# Patient Record
Sex: Female | Born: 2000 | Race: Black or African American | Hispanic: No | Marital: Single | Smoking: Never smoker
Health system: Southern US, Community
[De-identification: ages and names within clinical notes are randomized; demographics above are authoritative.]

## PROBLEM LIST (undated history)

## (undated) DIAGNOSIS — G43909 Migraine, unspecified, not intractable, without status migrainosus: Secondary | ICD-10-CM

## (undated) DIAGNOSIS — R011 Cardiac murmur, unspecified: Secondary | ICD-10-CM

## (undated) DIAGNOSIS — I10 Essential (primary) hypertension: Secondary | ICD-10-CM

## (undated) HISTORY — DX: Cardiac murmur, unspecified: R01.1

## (undated) HISTORY — PX: WISDOM TOOTH EXTRACTION: SHX21

## (undated) HISTORY — DX: Essential (primary) hypertension: I10

---
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
## 2019-05-10 DIAGNOSIS — G43909 Migraine, unspecified, not intractable, without status migrainosus: Secondary | ICD-10-CM

## 2019-05-10 HISTORY — DX: Migraine, unspecified, not intractable, without status migrainosus: G43.909

## 2019-05-16 ENCOUNTER — Other Ambulatory Visit: Payer: Self-pay

## 2019-05-16 DIAGNOSIS — Z20822 Contact with and (suspected) exposure to covid-19: Secondary | ICD-10-CM

## 2019-05-18 ENCOUNTER — Encounter (HOSPITAL_COMMUNITY): Payer: Self-pay | Admitting: Emergency Medicine

## 2019-05-18 ENCOUNTER — Other Ambulatory Visit: Payer: Self-pay

## 2019-05-18 ENCOUNTER — Ambulatory Visit (HOSPITAL_COMMUNITY)
Admission: EM | Admit: 2019-05-18 | Discharge: 2019-05-18 | Disposition: A | Discharge status: Home / Self Care | Payer: Medicaid Other | Attending: Emergency Medicine | Admitting: Emergency Medicine

## 2019-05-18 DIAGNOSIS — R519 Headache, unspecified: Secondary | ICD-10-CM

## 2019-05-18 LAB — NOVEL CORONAVIRUS, NAA: SARS-CoV-2, NAA: NOT DETECTED

## 2019-05-18 MED ORDER — TIZANIDINE HCL 4 MG PO TABS: 4.0000 mg | ORAL_TABLET | Freq: Three times a day (TID) | ORAL | 0 refills | Status: DC | PRN

## 2019-05-18 MED ORDER — ACETAMINOPHEN 325 MG PO TABS
975.0000 mg | ORAL_TABLET | Freq: Once | ORAL | Status: AC
  Administered 2019-05-18: 975 mg via ORAL

## 2019-05-18 MED ORDER — KETOROLAC TROMETHAMINE 30 MG/ML IJ SOLN
INTRAMUSCULAR | Status: AC
  Filled 2019-05-18: qty 1

## 2019-05-18 MED ORDER — DEXAMETHASONE SODIUM PHOSPHATE 10 MG/ML IJ SOLN
INTRAMUSCULAR | Status: AC
  Filled 2019-05-18: qty 1

## 2019-05-18 MED ORDER — ACETAMINOPHEN 325 MG PO TABS
ORAL_TABLET | ORAL | Status: AC
  Filled 2019-05-18: qty 3

## 2019-05-18 MED ORDER — DEXAMETHASONE SODIUM PHOSPHATE 10 MG/ML IJ SOLN
10.0000 mg | Freq: Once | INTRAMUSCULAR | Status: AC
  Administered 2019-05-18: 10 mg via INTRAMUSCULAR

## 2019-05-18 MED ORDER — IBUPROFEN 600 MG PO TABS: 600.0000 mg | ORAL_TABLET | Freq: Four times a day (QID) | ORAL | 0 refills | Status: DC | PRN

## 2019-05-18 MED ORDER — KETOROLAC TROMETHAMINE 30 MG/ML IJ SOLN
30.0000 mg | Freq: Once | INTRAMUSCULAR | Status: AC
  Administered 2019-05-18: 21:00:00 30 mg via INTRAMUSCULAR

## 2019-05-18 NOTE — ED Provider Notes (Signed)
HPI  SUBJECTIVE:  Diana Robinson is a 18 y.o. female who reports gradual onset throbbing, pounding frontal maxillary headaches for the past week that lasts hours.  She states it feels like a band around her head, and is located behind her eyes.  She has tried 500 mg of Tylenol once or twice a day, 400 mg ibuprofen once or twice a day with improvement in symptoms.  Symptoms are worse with looking at computer or TV screens due to the light.  Reports some mild photophobia.  No fevers, N/V, visual changes, purulent nasal d/c, nasal congestion, sinus pain/pressure, ear pain, jaw pain, dental pain,  dysarthria, focal weakness, facial droop, discoordination. No body aches, neck stiffness, rash. No mental status changes, seizures, syncope. Denies eye strain. No sudden onset, did not occur during exertion.  States that she drinks 64 ounces of water in a day.  Past medical history negative for aneurysm, SAH/ICH, HIV, sinusitis, glaucoma, stroke, atrial fibrillation or temporal arteritis.  No history of seizures, diabetes, hypertension, migraine.  Pt is not on any antiplatelets/anticoagulants.  LMP: Last Sunday.  Denies the possibility of being pregnant.  PMD: In [place].  She is in school here.   History reviewed. No pertinent past medical history.  History reviewed. No pertinent surgical history.  Family History  Problem Relation Age of Onset  . Healthy Mother   . Healthy Father     Social History   Tobacco Use  . Smoking status: Never Smoker  . Smokeless tobacco: Never Used  Substance Use Topics  . Alcohol use: Never    Frequency: Never  . Drug use: Never     Current Facility-Administered Medications:  .  acetaminophen (TYLENOL) tablet 975 mg, 975 mg, Oral, Once, Melynda Ripple, MD .  dexamethasone (DECADRON) injection 10 mg, 10 mg, Intramuscular, Once, Melynda Ripple, MD .  ketorolac (TORADOL) 30 MG/ML injection 30 mg, 30 mg, Intramuscular, Once, Melynda Ripple, MD  Current  Outpatient Medications:  .  ibuprofen (ADVIL) 600 MG tablet, Take 1 tablet (600 mg total) by mouth every 6 (six) hours as needed., Disp: 30 tablet, Rfl: 0 .  mupirocin ointment (BACTROBAN) 2 %, Apply 1 application topically 3 (three) times daily., Disp: 22 g, Rfl: 1 .  tiZANidine (ZANAFLEX) 4 MG tablet, Take 1 tablet (4 mg total) by mouth every 8 (eight) hours as needed for muscle spasms., Disp: 30 tablet, Rfl: 0  No Known Allergies   ROS  As noted in HPI.   Physical Exam  BP (!) 143/98   Pulse 98   Temp 98.4 F (36.9 C)   Resp 18   LMP 05/13/2019   SpO2 100%   Constitutional: Well developed, well nourished, no acute distress Eyes: PERRL, EOMI, conjunctiva normal bilaterally.  Mild photophobia HENT: Normocephalic, atraumatic,mucus membranes moist, normal dentition.  TM normal b/l. No TMJ tenderness.  No nasal congestion, no sinus tenderness. No temporal artery tenderness.  Neck: no cervical LN + bilateral trapezial muscle tenderness. No meningismus Respiratory: normal inspiratory effort, , lungs clear bilaterally  Cardiovascular: Normal rate, regular rhythm GI:  nondistended skin: No rash, skin intact Musculoskeletal: No edema, no tenderness, no deformities Neurologic: Alert & oriented x 3, CN III-XII intact, romberg neg, finger-> nose, heel-> shin equal b/l, Romberg neg, tandem gait steady Psychiatric: Speech and behavior appropriate   ED Course   Medications  ketorolac (TORADOL) 30 MG/ML injection 30 mg (has no administration in time range)  acetaminophen (TYLENOL) tablet 975 mg (has no administration in time range)  dexamethasone (DECADRON) injection 10 mg (has no administration in time range)  ketorolac (TORADOL) 30 MG/ML injection (has no administration in time range)  dexamethasone (DECADRON) 10 MG/ML injection (has no administration in time range)  acetaminophen (TYLENOL) 325 MG tablet (has no administration in time range)    No orders of the defined types were  placed in this encounter.  No results found for this or any previous visit (from the past 24 hour(s)). No results found.   ED Clinical Impression  1. Acute nonintractable headache, unspecified headache type     ED Assessment/Plan  no sudden onset. Doubt SAH, ICH or space occupying lesion. Pt without fevers/chills, Pt has no meningeal sx, no nuchal rigidity. Doubt meningitis. Pt with normal neuro exam, no evidence of CVA/TIA.  Pt BP not elevated significantly, doubt hypertensive emergency. No evidence of temporal artery tenderness, no evidence of glaucoma or other ocular pathology. Will give headache cocktail (dexamethasone 10 IM toradol 30 IM)  Presentation consistent with a tension headache/musculoskeletal headache.  Will send home with Tylenol/ibuprofen, Zanaflex.  Will provide primary care list for ongoing care.   Discussed MDM, plan for follow up, signs and sx that should prompt return to ER. Pt agrees with plan  Meds ordered this encounter  Medications  . ketorolac (TORADOL) 30 MG/ML injection 30 mg  . acetaminophen (TYLENOL) tablet 975 mg  . dexamethasone (DECADRON) injection 10 mg  . ibuprofen (ADVIL) 600 MG tablet    Sig: Take 1 tablet (600 mg total) by mouth every 6 (six) hours as needed.    Dispense:  30 tablet    Refill:  0  . tiZANidine (ZANAFLEX) 4 MG tablet    Sig: Take 1 tablet (4 mg total) by mouth every 8 (eight) hours as needed for muscle spasms.    Dispense:  30 tablet    Refill:  0    *This clinic note was created using [employer]. Therefore, there may be occasional mistakes despite careful proofreading.  ?    Melynda Ripple, MD 05/18/19 2101

## 2019-05-18 NOTE — Discharge Instructions (Addendum)
Continue drinking  64 ounces of water a day.  Take the next 2 days easy and get plenty of rest.  You may take 600 mg ibuprofen combined with 1 g of Tylenol 3-4 times a day as needed for pain.  The Zanaflex will help with any muscle tension.  Follow-up with the PMD of your choice, see list below.  Below is a list of primary care practices who are taking new patients for you to follow-up with.  [facility] [address] [place], [place] [address] [phone]  [place], [place] [address] [phone]  [facility] [phone] [place] [address]  [facility]: [place]  [phone]  [place] [facility]: [place]   [phone]  [facility]: [address] [place]  [phone]  [place] [facility] : [address] [place] [phone]  [facility]: [address] [place] [phone]  [facility]: [place] [phone]  Dr. Blanchie Serve [place]  [phone]  Dr. Benito Mccreedy, [facility]. [place],  [address]  [phone]  Go to www.goodrx.com to look up your medications. This will give you a list of where you can find your prescriptions at the most affordable prices. Or ask the pharmacist what the cash price is, or if they have any other discount programs available to help make your medication more affordable. This can be less expensive than what you would pay with insurance.

## 2019-05-18 NOTE — ED Triage Notes (Signed)
Pt c/o off and on headaches and sinus pain for several days. States she was tested for covid two days ago and it was negative.

## 2019-05-25 ENCOUNTER — Ambulatory Visit (INDEPENDENT_AMBULATORY_CARE_PROVIDER_SITE_OTHER): Payer: Medicaid Other | Admitting: Family Medicine

## 2019-05-25 ENCOUNTER — Encounter: Payer: Self-pay | Admitting: Family Medicine

## 2019-05-25 ENCOUNTER — Other Ambulatory Visit: Payer: Self-pay

## 2019-05-25 VITALS — BP 136/89 | Temp 98.5°F | Ht 65.0 in | Wt 134.8 lb

## 2019-05-25 DIAGNOSIS — Z09 Encounter for follow-up examination after completed treatment for conditions other than malignant neoplasm: Secondary | ICD-10-CM

## 2019-05-25 DIAGNOSIS — R030 Elevated blood-pressure reading, without diagnosis of hypertension: Secondary | ICD-10-CM | POA: Diagnosis not present

## 2019-05-25 DIAGNOSIS — Z7689 Persons encountering health services in other specified circumstances: Secondary | ICD-10-CM | POA: Diagnosis not present

## 2019-05-25 DIAGNOSIS — R519 Headache, unspecified: Secondary | ICD-10-CM

## 2019-05-25 LAB — POCT URINALYSIS DIPSTICK
Bilirubin, UA: NEGATIVE
Blood, UA: NEGATIVE
Glucose, UA: NEGATIVE
Ketones, UA: NEGATIVE
Leukocytes, UA: NEGATIVE
Nitrite, UA: NEGATIVE
Protein, UA: POSITIVE — AB
Spec Grav, UA: 1.025 (ref 1.010–1.025)
Urobilinogen, UA: 1 E.U./dL
pH, UA: 7 (ref 5.0–8.0)

## 2019-05-25 NOTE — Progress Notes (Signed)
Patient [facility]    New Patient--Hospital Follow Up--New Patient  Subjective:  Patient ID: Diana Robinson, female    DOB: [birth year]  Age: 18 y.o. MRN: [id number]  CC:  Chief Complaint  Patient presents with  . Follow-up    Headache    HPI Diana Robinson is a 18 year old female who presents for Hospital Follow Up and Establish Care today.   Past Medical History:  Diagnosis Date  . Migraine 05/2019   Current Status: This will be her initial office visit with me. She was previously seeing a Lexicographer in [place], [place] at [facility] on [address], for her PCP needs. Since her last office visit, she has had an Urgent Care Visit for Migraine Headaches. Her blood pressures have been elevated lately. Today, she states that she is doing well with no complaints. She is a current Ship broker at [employer]. She reports occasional alcohol use. She is currently not on birth control. She denies fevers, chills, fatigue, recent infections, weight loss, and night sweats. She has not had any headaches, visual changes, dizziness, and falls. No chest pain, heart palpitations, cough and shortness of breath reported. No reports of GI problems such as nausea, vomiting, diarrhea, and constipation. She has no reports of blood in stools, dysuria and hematuria. No depression or anxiety reported today. She denies pain today.   No past surgical history on file.  Family History  Problem Relation Age of Onset  . Healthy Mother   . Healthy Father     Social History   Socioeconomic History  . Marital status: Single    Spouse name: Not on file  . Number of children: Not on file  . Years of education: Not on file  . Highest education level: Not on file  Occupational History  . Not on file  Social Needs  . Financial resource strain: Not on file  . Food insecurity    Worry: Not on file    Inability: Not on file  . Transportation needs    Medical: Not on file   Non-medical: Not on file  Tobacco Use  . Smoking status: Never Smoker  . Smokeless tobacco: Never Used  Substance and Sexual Activity  . Alcohol use: Never    Frequency: Never  . Drug use: Never  . Sexual activity: Not Currently  Lifestyle  . Physical activity    Days per week: Not on file    Minutes per session: Not on file  . Stress: Not on file  Relationships  . Social Herbalist on phone: Not on file    Gets together: Not on file    Attends religious service: Not on file    Active member of club or organization: Not on file    Attends meetings of clubs or organizations: Not on file    Relationship status: Not on file  . Intimate partner violence    Fear of current or ex partner: Not on file    Emotionally abused: Not on file    Physically abused: Not on file    Forced sexual activity: Not on file  Other Topics Concern  . Not on file  Social History Narrative  . Not on file    Outpatient Medications Prior to Visit  Medication Sig Dispense Refill  . ibuprofen (ADVIL) 600 MG tablet Take 1 tablet (600 mg total) by mouth every 6 (six) hours as needed. 30 tablet 0  . tiZANidine (ZANAFLEX)  4 MG tablet Take 1 tablet (4 mg total) by mouth every 8 (eight) hours as needed for muscle spasms. 30 tablet 0  . mupirocin ointment (BACTROBAN) 2 % Apply 1 application topically 3 (three) times daily. 22 g 1   No facility-administered medications prior to visit.     No Known Allergies  ROS Review of Systems  Constitutional: Negative.   HENT: Negative.   Eyes: Negative.   Respiratory: Negative.   Cardiovascular: Negative.   Gastrointestinal: Negative.   Endocrine: Negative.   Genitourinary: Negative.   Musculoskeletal: Negative.   Skin: Negative.   Allergic/Immunologic: Negative.   Neurological: Positive for headaches (Frequent).  Hematological: Negative.   Psychiatric/Behavioral: Negative.    Objective:    Physical Exam  Constitutional: She is oriented to  person, place, and time. She appears well-developed and well-nourished.  HENT:  Head: Normocephalic and atraumatic.  Eyes: Conjunctivae are normal.  Neck: Normal range of motion. Neck supple.  Cardiovascular: Normal rate, normal heart sounds and intact distal pulses.  Pulmonary/Chest: Effort normal and breath sounds normal.  Abdominal: Soft. Bowel sounds are normal.  Musculoskeletal: Normal range of motion.  Neurological: She is alert and oriented to person, place, and time. She has normal reflexes.  Skin: Skin is warm and dry.  Psychiatric: She has a normal mood and affect. Her behavior is normal. Judgment and thought content normal.  Nursing note and vitals reviewed.   BP 136/89 (BP Location: Left Arm, Patient Position: Sitting, Cuff Size: Normal)   Temp 98.5 F (36.9 C) (Oral)   Ht 5\' 5"  (1.651 m)   Wt 134 lb 12.8 oz (61.1 kg)   LMP 05/13/2019   SpO2 100%   BMI 22.43 kg/m  Wt Readings from Last 3 Encounters:  05/25/19 134 lb 12.8 oz (61.1 kg) (62 %, Z= 0.32)*   * Growth percentiles are based on CDC (Girls, 2-20 Years) data.     Health Maintenance Due  Topic Date Due  . HIV Screening  11/08/2014  . TETANUS/TDAP  11/08/2018  . INFLUENZA VACCINE  03/10/2019    There are no preventive care reminders to display for this patient.  No results found for: TSH Lab Results  Component Value Date   WBC 4.0 05/25/2019   HGB 12.8 05/25/2019   HCT 39.9 05/25/2019   MCV 80 05/25/2019   PLT 266 05/25/2019   Lab Results  Component Value Date   NA 137 05/25/2019   K 4.5 05/25/2019   CO2 24 05/25/2019   GLUCOSE 93 05/25/2019   BUN 10 05/25/2019   CREATININE 0.90 05/25/2019   BILITOT 0.3 05/25/2019   ALKPHOS 104 05/25/2019   AST 22 05/25/2019   ALT 15 05/25/2019   PROT 7.8 05/25/2019   ALBUMIN 4.6 05/25/2019   CALCIUM 9.6 05/25/2019   No results found for: CHOL No results found for: HDL No results found for: LDLCALC No results found for: TRIG No results found for:  CHOLHDL No results found for: HGBA1C    Assessment & Plan:   1. Hospital discharge follow-up  2. Encounter to establish care  3. Elevated BP without diagnosis of hypertension - POCT urinalysis dipstick - CBC with Differential - Comprehensive metabolic panel; Future - Comprehensive metabolic panel  4. Frequent headaches She will make healthier food choices. She will monitor her food and beverage intake. She will increase her water intake. She will contact office if she needs additional Rx for headaches. She will follow up in 1 month. We will continue to monitor.  -  CBC with Differential - Comprehensive metabolic panel; Future - Comprehensive metabolic panel  5. Follow up She will follow up in 1 month.   No orders of the defined types were placed in this encounter.   Orders Placed This Encounter  Procedures  . CBC with Differential  . Comprehensive metabolic panel  . POCT urinalysis dipstick    Referral Orders  No referral(s) requested today    Kathe Becton,  MSN, FNP-BC [place] [address] [place], [place] [address] [phone]- fax   Problem List Items Addressed This Visit    None    Visit Diagnoses    Hospital discharge follow-up    -  Primary   Encounter to establish care       Elevated BP without diagnosis of hypertension       Relevant Orders   POCT urinalysis dipstick (Completed)   CBC with Differential (Completed)   Comprehensive metabolic panel (Completed)   Frequent headaches       Relevant Orders   CBC with Differential (Completed)   Comprehensive metabolic panel (Completed)   Follow up          No orders of the defined types were placed in this encounter.   Follow-up: Return in about 1 month (around 06/25/2019).    Azzie Glatter, FNP

## 2019-05-25 NOTE — Patient Instructions (Signed)
Contraception Choices Contraception, also called birth control, means things to use or ways to try not to get pregnant. Hormonal birth control This kind of birth control uses hormones. Here are some types of hormonal birth control:  A tube that is put under skin of the arm (implant). The tube can stay in for as long as 3 years.  Shots to get every 3 months (injections).  Pills to take every day (birth control pills).  A patch to change 1 time each week for 3 weeks (birth control patch). After that, the patch is taken off for 1 week.  A ring to put in the vagina. The ring is left in for 3 weeks. Then it is taken out of the vagina for 1 week. Then a new ring is put in.  Pills to take after unprotected sex (emergency birth control pills). Barrier birth control Here are some types of barrier birth control:  A thin covering that is put on the penis before sex (female condom). The covering is thrown away after sex.  A soft, loose covering that is put in the vagina before sex (female condom). The covering is thrown away after sex.  A rubber bowl that sits over the cervix (diaphragm). The bowl must be made for you. The bowl is put into the vagina before sex. The bowl is left in for 6-8 hours after sex. It is taken out within 24 hours.  A small, soft cup that fits over the cervix (cervical cap). The cup must be made for you. The cup can be left in for 6-8 hours after sex. It is taken out within 48 hours.  A sponge that is put into the vagina before sex. It must be left in for at least 6 hours after sex. It must be taken out within 30 hours. Then it is thrown away.  A chemical that kills or stops sperm from getting into the uterus (spermicide). It may be a pill, cream, jelly, or foam to put in the vagina. The chemical should be used at least 10-15 minutes before sex. IUD (intrauterine) birth control An IUD is a small, T-shaped piece of plastic. It is put inside the uterus. There are two kinds:   Hormone IUD. This kind can stay in for 3-5 years.  Copper IUD. This kind can stay in for 10 years. Permanent birth control Here are some types of permanent birth control:  Surgery to block the fallopian tubes.  Having an insert put into each fallopian tube.  Surgery to tie off the tubes that carry sperm (vasectomy). Natural planning birth control Here are some types of natural planning birth control:  Not having sex on the days the woman could get pregnant.  Using a calendar: ? To keep track of the length of each period. ? To find out what days pregnancy can happen. ? To plan to not have sex on days when pregnancy can happen.  Watching for symptoms of ovulation and not having sex during ovulation. One way the woman can check for ovulation is to check her temperature.  Waiting to have sex until after ovulation. Summary  Contraception, also called birth control, means things to use or ways to try not to get pregnant.  Hormonal methods of birth control include implants, injections, pills, patches, vaginal rings, and emergency birth control pills.  Barrier methods of birth control can include female condoms, female condoms, diaphragms, cervical caps, sponges, and spermicides.  There are two types of IUD (intrauterine device) birth control.  An IUD can be put in a woman's uterus to prevent pregnancy for 3-5 years.  Permanent sterilization can be done through a procedure for males, females, or both.  Natural planning methods involve not having sex on the days when the woman could get pregnant. This information is not intended to replace advice given to you by your health care provider. Make sure you discuss any questions you have with your health care provider. Document Released: 05/23/2009 Document Revised: 11/15/2018 Document Reviewed: 08/05/2016 Elsevier Patient Education  2020 [place]. Preventing Hypertension Hypertension, commonly called high blood pressure, is when the  force of blood pumping through the arteries is too strong. Arteries are blood vessels that carry blood from the heart throughout the body. Over time, hypertension can damage the arteries and decrease blood flow to important parts of the body, including the brain, heart, and kidneys. Often, hypertension does not cause symptoms until blood pressure is very high. For this reason, it is important to have your blood pressure checked on a regular basis. Hypertension can often be prevented with diet and lifestyle changes. If you already have hypertension, you can control it with diet and lifestyle changes, as well as medicine. What nutrition changes can be made? Maintain a healthy diet. This includes:  Eating less salt (sodium). Ask your health care provider how much sodium is safe for you to have. The general recommendation is to consume less than 1 tsp (2,300 mg) of sodium a day. ? Do not add salt to your food. ? Choose low-sodium options when grocery shopping and eating out.  Limiting fats in your diet. You can do this by eating low-fat or fat-free dairy products and by eating less red meat.  Eating more fruits, vegetables, and whole grains. Make a goal to eat: ? 1-2 cups of fresh fruits and vegetables each day. ? 3-4 servings of whole grains each day.  Avoiding foods and beverages that have added sugars.  Eating fish that contain healthy fats (omega-3 fatty acids), such as mackerel or salmon. If you need help putting together a healthy eating plan, try the DASH diet. This diet is high in fruits, vegetables, and whole grains. It is low in sodium, red meat, and added sugars. DASH stands for Dietary Approaches to Stop Hypertension. What lifestyle changes can be made?   Lose weight if you are overweight. Losing just 3?5% of your body weight can help prevent or control hypertension. ? For example, if your present weight is 200 lb (91 kg), a loss of 3-5% of your weight means losing 6-10 lb (2.7-4.5  kg). ? Ask your health care provider to help you with a diet and exercise plan to safely lose weight.  Get enough exercise. Do at least 150 minutes of moderate-intensity exercise each week. ? You could do this in short exercise sessions several times a day, or you could do longer exercise sessions a few times a week. For example, you could take a brisk 10-minute walk or bike ride, 3 times a day, for 5 days a week.  Find ways to reduce stress, such as exercising, meditating, listening to music, or taking a yoga class. If you need help reducing stress, ask your health care provider.  Do not smoke. This includes e-cigarettes. Chemicals in tobacco and nicotine products raise your blood pressure each time you smoke. If you need help quitting, ask your health care provider.  Avoid alcohol. If you drink alcohol, limit alcohol intake to no more than 1 drink a day  for nonpregnant women and 2 drinks a day for men. One drink equals 12 oz of beer, 5 oz of wine, or 1 oz of hard liquor. Why are these changes important? Diet and lifestyle changes can help you prevent hypertension, and they may make you feel better overall and improve your quality of life. If you have hypertension, making these changes will help you control it and help prevent major complications, such as:  Hardening and narrowing of arteries that supply blood to: ? Your heart. This can cause a heart attack. ? Your brain. This can cause a stroke. ? Your kidneys. This can cause kidney failure.  Stress on your heart muscle, which can cause heart failure. What can I do to lower my risk?  Work with your health care provider to make a hypertension prevention plan that works for you. Follow your plan and keep all follow-up visits as told by your health care provider.  Learn how to check your blood pressure at home. Make sure that you know your personal target blood pressure, as told by your health care provider. How is this treated? In addition  to diet and lifestyle changes, your health care provider may recommend medicines to help lower your blood pressure. You may need to try a few different medicines to find what works best for you. You also may need to take more than one medicine. Take over-the-counter and prescription medicines only as told by your health care provider. Where to find support Your health care provider can help you prevent hypertension and help you keep your blood pressure at a healthy level. Your local hospital or your community may also provide support services and prevention programs. The American Heart Association offers an online support network at: CheapBootlegs.com.cy Where to find more information Learn more about hypertension from:  [place], Lung, and Blood Institute: ElectronicHangman.is  Centers for Disease Control and Prevention: https://ingram.com/  American Academy of Family Physicians: http://familydoctor.org/familydoctor/en/diseases-conditions/high-blood-pressure.printerview.all.html Learn more about the DASH diet from:  [place], Lung, and [place]: https://www.reyes.com/ Contact a health care provider if:  You think you are having a reaction to medicines you have taken.  You have recurrent headaches or feel dizzy.  You have swelling in your ankles.  You have trouble with your vision. Summary  Hypertension often does not cause any symptoms until blood pressure is very high. It is important to get your blood pressure checked regularly.  Diet and lifestyle changes are the most important steps in preventing hypertension.  By keeping your blood pressure in a healthy range, you can prevent complications like heart attack, heart failure, stroke, and kidney failure.  Work with your health care provider to make a hypertension prevention plan that works for you. This information is  not intended to replace advice given to you by your health care provider. Make sure you discuss any questions you have with your health care provider. Document Released: 08/10/2015 Document Revised: 11/17/2018 Document Reviewed: 04/05/2016 Elsevier Patient Education  [place]. Dehydration, Adult  Dehydration is when there is not enough fluid or water in your body. This happens when you lose more fluids than you take in. Dehydration can range from mild to very bad. It should be treated right away to keep it from getting very bad. Symptoms of mild dehydration may include:  Thirst.  Dry lips.  Slightly dry mouth.  Dry, warm skin.  Dizziness. Symptoms of moderate dehydration may include:  Very dry mouth.  Muscle cramps.  Dark pee (urine). Pee  may be the color of tea.  Your body making less pee.  Your eyes making fewer tears.  Heartbeat that is uneven or faster than normal (palpitations).  Headache.  Light-headedness, especially when you stand up from sitting.  Fainting (syncope). Symptoms of very bad dehydration may include:  Changes in skin, such as: ? Cold and clammy skin. ? Blotchy (mottled) or pale skin. ? Skin that does not quickly return to normal after being lightly pinched and let go (poor skin turgor).  Changes in body fluids, such as: ? Feeling very thirsty. ? Your eyes making fewer tears. ? Not sweating when body temperature is high, such as in hot weather. ? Your body making very little pee.  Changes in vital signs, such as: ? Weak pulse. ? Pulse that is more than 100 beats a minute when you are sitting still. ? Fast breathing. ? Low blood pressure.  Other changes, such as: ? Sunken eyes. ? Cold hands and feet. ? Confusion. ? Lack of energy (lethargy). ? Trouble waking up from sleep. ? Short-term weight loss. ? Unconsciousness. Follow these instructions at home:   If told by your doctor, drink an ORS: ? Make an ORS by using  instructions on the package. ? Start by drinking small amounts, about  cup (120 mL) every 5-10 minutes. ? Slowly drink more until you have had the amount that your doctor said to have.  Drink enough clear fluid to keep your pee clear or pale yellow. If you were told to drink an ORS, finish the ORS first, then start slowly drinking clear fluids. Drink fluids such as: ? Water. Do not drink only water by itself. Doing that can make the salt (sodium) level in your body get too low (hyponatremia). ? Ice chips. ? Fruit juice that you have added water to (diluted). ? Low-calorie sports drinks.  Avoid: ? Alcohol. ? Drinks that have a lot of sugar. These include high-calorie sports drinks, fruit juice that does not have water added, and soda. ? Caffeine. ? Foods that are greasy or have a lot of fat or sugar.  Take over-the-counter and prescription medicines only as told by your doctor.  Do not take salt tablets. Doing that can make the salt level in your body get too high (hypernatremia).  Eat foods that have minerals (electrolytes). Examples include bananas, oranges, potatoes, tomatoes, and spinach.  Keep all follow-up visits as told by your doctor. This is important. Contact a doctor if:  You have belly (abdominal) pain that: ? Gets worse. ? Stays in one area (localizes).  You have a rash.  You have a stiff neck.  You get angry or annoyed more easily than normal (irritability).  You are more sleepy than normal.  You have a harder time waking up than normal.  You feel: ? Weak. ? Dizzy. ? Very thirsty.  You have peed (urinated) only a small amount of very dark pee during 6-8 hours. Get help right away if:  You have symptoms of very bad dehydration.  You cannot drink fluids without throwing up (vomiting).  Your symptoms get worse with treatment.  You have a fever.  You have a very bad headache.  You are throwing up or having watery poop (diarrhea) and it: ? Gets  worse. ? Does not go away.  You have blood or something green (bile) in your throw-up.  You have blood in your poop (stool). This may cause poop to look black and tarry.  You have not peed  in 6-8 hours.  You pass out (faint).  Your heart rate when you are sitting still is more than 100 beats a minute.  You have trouble breathing. This information is not intended to replace advice given to you by your health care provider. Make sure you discuss any questions you have with your health care provider. Document Released: 05/22/2009 Document Revised: 07/08/2017 Document Reviewed: 09/19/2015 Elsevier Patient Education  2020 [employer].

## 2019-05-26 DIAGNOSIS — R519 Headache, unspecified: Secondary | ICD-10-CM | POA: Insufficient documentation

## 2019-05-26 DIAGNOSIS — R030 Elevated blood-pressure reading, without diagnosis of hypertension: Secondary | ICD-10-CM | POA: Insufficient documentation

## 2019-05-26 LAB — CBC WITH DIFFERENTIAL/PLATELET
Basophils Absolute: 0 10*3/uL (ref 0.0–0.2)
Basos: 1 %
EOS (ABSOLUTE): 0 10*3/uL (ref 0.0–0.4)
Eos: 1 %
Hematocrit: 39.9 % (ref 34.0–46.6)
Hemoglobin: 12.8 g/dL (ref 11.1–15.9)
Immature Grans (Abs): 0 10*3/uL (ref 0.0–0.1)
Immature Granulocytes: 0 %
Lymphocytes Absolute: 2 10*3/uL (ref 0.7–3.1)
Lymphs: 48 %
MCH: 25.6 pg — ABNORMAL LOW (ref 26.6–33.0)
MCHC: 32.1 g/dL (ref 31.5–35.7)
MCV: 80 fL (ref 79–97)
Monocytes Absolute: 0.3 10*3/uL (ref 0.1–0.9)
Monocytes: 8 %
Neutrophils Absolute: 1.7 10*3/uL (ref 1.4–7.0)
Neutrophils: 42 %
Platelets: 266 10*3/uL (ref 150–450)
RBC: 5 x10E6/uL (ref 3.77–5.28)
RDW: 14.8 % (ref 11.7–15.4)
WBC: 4 10*3/uL (ref 3.4–10.8)

## 2019-05-26 LAB — COMPREHENSIVE METABOLIC PANEL
ALT: 15 IU/L (ref 0–32)
AST: 22 IU/L (ref 0–40)
Albumin/Globulin Ratio: 1.4 (ref 1.2–2.2)
Albumin: 4.6 g/dL (ref 3.9–5.0)
Alkaline Phosphatase: 104 IU/L (ref 39–117)
BUN/Creatinine Ratio: 11 (ref 9–23)
BUN: 10 mg/dL (ref 6–20)
Bilirubin Total: 0.3 mg/dL (ref 0.0–1.2)
CO2: 24 mmol/L (ref 20–29)
Calcium: 9.6 mg/dL (ref 8.7–10.2)
Chloride: 101 mmol/L (ref 96–106)
Creatinine, Ser: 0.9 mg/dL (ref 0.57–1.00)
GFR calc Af Amer: 107 mL/min/{1.73_m2} (ref >59)
GFR calc non Af Amer: 93 mL/min/{1.73_m2} (ref >59)
Globulin, Total: 3.2 g/dL (ref 1.5–4.5)
Glucose: 93 mg/dL (ref 65–99)
Potassium: 4.5 mmol/L (ref 3.5–5.2)
Sodium: 137 mmol/L (ref 134–144)
Total Protein: 7.8 g/dL (ref 6.0–8.5)

## 2019-06-25 ENCOUNTER — Ambulatory Visit (INDEPENDENT_AMBULATORY_CARE_PROVIDER_SITE_OTHER): Payer: Medicaid Other | Admitting: Family Medicine

## 2019-06-25 ENCOUNTER — Encounter: Payer: Self-pay | Admitting: Family Medicine

## 2019-06-25 ENCOUNTER — Other Ambulatory Visit: Payer: Self-pay

## 2019-06-25 VITALS — BP 141/83 | HR 90 | Temp 98.6°F | Ht 65.0 in | Wt 139.0 lb

## 2019-06-25 DIAGNOSIS — R519 Headache, unspecified: Secondary | ICD-10-CM | POA: Diagnosis not present

## 2019-06-25 DIAGNOSIS — R030 Elevated blood-pressure reading, without diagnosis of hypertension: Secondary | ICD-10-CM | POA: Diagnosis not present

## 2019-06-25 DIAGNOSIS — Z0000 Encounter for general adult medical examination without abnormal findings: Secondary | ICD-10-CM | POA: Diagnosis not present

## 2019-06-25 DIAGNOSIS — Z09 Encounter for follow-up examination after completed treatment for conditions other than malignant neoplasm: Secondary | ICD-10-CM | POA: Diagnosis not present

## 2019-06-25 LAB — POCT URINALYSIS DIPSTICK
Bilirubin, UA: NEGATIVE
Blood, UA: NEGATIVE
Glucose, UA: NEGATIVE
Ketones, UA: NEGATIVE
Leukocytes, UA: NEGATIVE
Nitrite, UA: NEGATIVE
Protein, UA: NEGATIVE
Spec Grav, UA: 1.02 (ref 1.010–1.025)
Urobilinogen, UA: 0.2 E.U./dL
pH, UA: 7 (ref 5.0–8.0)

## 2019-06-25 NOTE — Progress Notes (Signed)
Patient [facility]  Annual Physical  Subjective:  Patient ID: Diana Robinson, female    DOB: 2001-04-16  Age: 18 y.o. MRN: [id number]  CC:  Chief Complaint  Patient presents with  . Follow-up     1 month follow up migraines    HPI Diana Robinson is a 18 year old female who presents for her Annual Physical today.   Past Medical History:  Diagnosis Date  . Migraine 05/2019   Current Status: Since her last office visit, she is doing well with no complaints. She has c/t of Migraines, which she takes OTC pain medication for relief. She has not had any visual changes, dizziness, and falls. She denies fevers, chills, fatigue, recent infections, weight loss, and night sweats.. No chest pain, heart palpitations, cough and shortness of breath reported. No reports of GI problems such as nausea, vomiting, diarrhea, and constipation. She has no reports of blood in stools, dysuria and hematuria. No depression or anxiety, and denies suicidal ideations, homicidal ideations, or auditory hallucinations. She denies pain today.   History reviewed. No pertinent surgical history.  Family History  Problem Relation Age of Onset  . Healthy Mother   . Healthy Father     Social History   Socioeconomic History  . Marital status: Single    Spouse name: Not on file  . Number of children: Not on file  . Years of education: Not on file  . Highest education level: Not on file  Occupational History  . Not on file  Social Needs  . Financial resource strain: Not on file  . Food insecurity    Worry: Not on file    Inability: Not on file  . Transportation needs    Medical: Not on file    Non-medical: Not on file  Tobacco Use  . Smoking status: Never Smoker  . Smokeless tobacco: Never Used  Substance and Sexual Activity  . Alcohol use: Never    Frequency: Never  . Drug use: Never  . Sexual activity: Not Currently  Lifestyle  . Physical activity    Days per  week: Not on file    Minutes per session: Not on file  . Stress: Not on file  Relationships  . Social Herbalist on phone: Not on file    Gets together: Not on file    Attends religious service: Not on file    Active member of club or organization: Not on file    Attends meetings of clubs or organizations: Not on file    Relationship status: Not on file  . Intimate partner violence    Fear of current or ex partner: Not on file    Emotionally abused: Not on file    Physically abused: Not on file    Forced sexual activity: Not on file  Other Topics Concern  . Not on file  Social History Narrative  . Not on file    Outpatient Medications Prior to Visit  Medication Sig Dispense Refill  . ibuprofen (ADVIL) 600 MG tablet Take 1 tablet (600 mg total) by mouth every 6 (six) hours as needed. 30 tablet 0  . tiZANidine (ZANAFLEX) 4 MG tablet Take 1 tablet (4 mg total) by mouth every 8 (eight) hours as needed for muscle spasms. 30 tablet 0   No facility-administered medications prior to visit.     No Known Allergies  ROS Review of Systems  Constitutional: Negative.   HENT: Negative.  Eyes: Negative.   Respiratory: Negative.   Cardiovascular: Negative.   Endocrine: Negative.   Genitourinary: Negative.   Allergic/Immunologic: Negative.   Neurological: Positive for headaches (occasional ).  Hematological: Negative.   Psychiatric/Behavioral: Negative.       Objective:    Physical Exam  Constitutional: She is oriented to person, place, and time. She appears well-developed and well-nourished.  HENT:  Head: Normocephalic and atraumatic.  Eyes: Conjunctivae are normal.  Neck: Normal range of motion. Neck supple.  Cardiovascular: Normal rate, regular rhythm, normal heart sounds and intact distal pulses.  Pulmonary/Chest: Effort normal and breath sounds normal.  Abdominal: Soft. Bowel sounds are normal.  Musculoskeletal: Normal range of motion.  Neurological: She is  alert and oriented to person, place, and time. She has normal reflexes.  Skin: Skin is warm.  Psychiatric: She has a normal mood and affect. Her behavior is normal. Judgment and thought content normal.  Nursing note and vitals reviewed.   BP (!) 141/83   Pulse 90   Temp 98.6 F (37 C)   Ht 5\' 5"  (1.651 m)   Wt 139 lb (63 kg)   LMP 06/09/2019   SpO2 98%   BMI 23.13 kg/m  Wt Readings from Last 3 Encounters:  06/25/19 139 lb (63 kg) (68 %, Z= 0.48)*  05/25/19 134 lb 12.8 oz (61.1 kg) (62 %, Z= 0.32)*   * Growth percentiles are based on CDC (Girls, 2-20 Years) data.     Health Maintenance Due  Topic Date Due  . HIV Screening  11/08/2014  . TETANUS/TDAP  11/08/2018  . INFLUENZA VACCINE  03/10/2019    There are no preventive care reminders to display for this patient.  No results found for: TSH Lab Results  Component Value Date   WBC 4.0 05/25/2019   HGB 12.8 05/25/2019   HCT 39.9 05/25/2019   MCV 80 05/25/2019   PLT 266 05/25/2019   Lab Results  Component Value Date   NA 137 05/25/2019   K 4.5 05/25/2019   CO2 24 05/25/2019   GLUCOSE 93 05/25/2019   BUN 10 05/25/2019   CREATININE 0.90 05/25/2019   BILITOT 0.3 05/25/2019   ALKPHOS 104 05/25/2019   AST 22 05/25/2019   ALT 15 05/25/2019   PROT 7.8 05/25/2019   ALBUMIN 4.6 05/25/2019   CALCIUM 9.6 05/25/2019   No results found for: CHOL No results found for: HDL No results found for: LDLCALC No results found for: TRIG No results found for: CHOLHDL No results found for: HGBA1C    Assessment & Plan:   1. Annual physical exam Normal physical exam today.   2. Elevated BP without diagnosis of hypertension Blood pressure is stable today.  - Urinalysis Dipstick  3. Frequent headaches Stable/   4. Follow up She will follow up in 6 months.   No orders of the defined types were placed in this encounter.   Orders Placed This Encounter  Procedures  . Urinalysis Dipstick    Referral Orders  No  referral(s) requested today    Kathe Becton,  MSN, FNP-BC [facility] [address] [place], [place] [address] [phone]- fax  Problem List Items Addressed This Visit      Other   Elevated BP without diagnosis of hypertension   Relevant Orders   Urinalysis Dipstick (Completed)   Frequent headaches    Other Visit Diagnoses    Annual physical exam    -  Primary   Follow up          No orders of the defined types were placed in this encounter.   Follow-up: Return in about 6 months (around 12/23/2019).    Azzie Glatter, FNP

## 2019-10-29 ENCOUNTER — Encounter (HOSPITAL_COMMUNITY): Payer: Self-pay | Admitting: Emergency Medicine

## 2019-10-29 ENCOUNTER — Ambulatory Visit (HOSPITAL_COMMUNITY)
Admission: EM | Admit: 2019-10-29 | Discharge: 2019-10-29 | Disposition: A | Discharge status: Home / Self Care | Payer: Medicaid Other | Attending: Internal Medicine | Admitting: Internal Medicine

## 2019-10-29 ENCOUNTER — Other Ambulatory Visit: Payer: Self-pay

## 2019-10-29 DIAGNOSIS — Z202 Contact with and (suspected) exposure to infections with a predominantly sexual mode of transmission: Secondary | ICD-10-CM | POA: Diagnosis not present

## 2019-10-29 MED ORDER — AZITHROMYCIN 250 MG PO TABS
ORAL_TABLET | ORAL | Status: AC
  Filled 2019-10-29: qty 4

## 2019-10-29 MED ORDER — AZITHROMYCIN 250 MG PO TABS
1000.0000 mg | ORAL_TABLET | Freq: Once | ORAL | Status: AC
  Administered 2019-10-29: 12:00:00 1000 mg via ORAL

## 2019-10-29 NOTE — ED Provider Notes (Signed)
[facility]    CSN: [id number] Arrival date & time: 10/29/19  1111      History   Chief Complaint Chief Complaint  Patient presents with  . SEXUALLY TRANSMITTED DISEASE    HPI Diana Robinson is a 19 y.o. female comes to urgent care for STD screening after she found out that her sexual partner has had sexual contact with a Chlamydia positive patient.  She denies any dysuria, urgency or frequency.  No pelvic or abdominal pain.  No vaginal discharge or vaginal irritation.  No deep dyspareunia. Past Medical History:  Diagnosis Date  . Migraine 05/2019    Patient Active Problem List   Diagnosis Date Noted  . Elevated BP without diagnosis of hypertension 05/26/2019  . Frequent headaches 05/26/2019    No past surgical history on file.  OB History    Gravida  0   Para  0   Term  0   Preterm  0   AB  0   Living  0     SAB  0   TAB  0   Ectopic  0   Multiple  0   Live Births  0            Home Medications    Prior to Admission medications   Not on File    Family History Family History  Problem Relation Age of Onset  . Healthy Mother   . Healthy Father     Social History Social History   Tobacco Use  . Smoking status: Never Smoker  . Smokeless tobacco: Never Used  Substance Use Topics  . Alcohol use: Never  . Drug use: Never     Allergies   Patient has no known allergies.   Review of Systems Review of Systems  Constitutional: Negative for activity change, chills and fatigue.  Genitourinary: Negative for dysuria, flank pain, frequency, hematuria and urgency.  Musculoskeletal: Negative for arthralgias, gait problem and myalgias.  Skin: Negative for rash and wound.  Neurological: Negative for dizziness, numbness and headaches.     Physical Exam Triage Vital Signs ED Triage Vitals  Enc Vitals Group     BP 10/29/19 1149 (!) 147/78     Pulse Rate 10/29/19 1149 71     Resp 10/29/19 1149 16     Temp 10/29/19 1149 98.1 F  (36.7 C)     Temp Source 10/29/19 1149 Oral     SpO2 10/29/19 1149 98 %     Weight --      Height --      Head Circumference --      Peak Flow --      Pain Score 10/29/19 1157 0     Pain Loc --      Pain Edu? --      Excl. in [place]? --    No data found.  Updated Vital Signs BP (!) 147/78 (BP Location: Left Arm)   Pulse 71   Temp 98.1 F (36.7 C) (Oral)   Resp 16   LMP 10/22/2019   SpO2 98%   Visual Acuity Right Eye Distance:   Left Eye Distance:   Bilateral Distance:    Right Eye Near:   Left Eye Near:    Bilateral Near:     Physical Exam Constitutional:      General: She is not in acute distress.    Appearance: Normal appearance. She is not ill-appearing.  Cardiovascular:     Rate and Rhythm: Normal rate  and regular rhythm.     Pulses: Normal pulses.     Heart sounds: Normal heart sounds.  Pulmonary:     Effort: Pulmonary effort is normal.     Breath sounds: Normal breath sounds. No wheezing or rhonchi.  Abdominal:     General: Bowel sounds are normal. There is no distension.     Palpations: Abdomen is soft. There is no mass.     Tenderness: There is no abdominal tenderness.  Musculoskeletal:        General: Normal range of motion.  Skin:    Capillary Refill: Capillary refill takes less than 2 seconds.  Neurological:     Mental Status: She is alert.      UC Treatments / Results  Labs (all labs ordered are listed, but only abnormal results are displayed) Labs Reviewed  CERVICOVAGINAL ANCILLARY ONLY    EKG   Radiology No results found.  Procedures Procedures (including critical care time)  Medications Ordered in UC Medications  azithromycin (ZITHROMAX) tablet 1,000 mg (1,000 mg Oral Given 10/29/19 1215)    Initial Impression / Assessment and Plan / UC Course  I have reviewed the triage vital signs and the nursing notes.  Pertinent labs & imaging results that were available during my care of the patient were reviewed by me and considered in  my medical decision making (see chart for details).     1.  Exposure to chlamydia infection: Azithromycin 1 g x 1 dose Cervicovaginal swab for GC/chlamydia/trichomonas Safe sex practices Patient is advised to abstain from sexual intercourse for 1 week Treatment will be updated once GC/trichomonas/chlamydia test results are available. Final Clinical Impressions(s) / UC Diagnoses   Final diagnoses:  Exposure to chlamydia   Discharge Instructions   None    ED Prescriptions    None     PDMP not reviewed this encounter.   Chase Picket, MD 10/29/19 [phone]

## 2019-10-29 NOTE — ED Triage Notes (Signed)
Patient wants to be checked for std.  Denies pain or vaginal discharge

## 2019-10-30 LAB — CERVICOVAGINAL ANCILLARY ONLY
Chlamydia: NEGATIVE
Neisseria Gonorrhea: NEGATIVE
Trichomonas: NEGATIVE

## 2019-12-24 ENCOUNTER — Ambulatory Visit: Payer: Medicaid Other | Admitting: Family Medicine

## 2020-04-01 ENCOUNTER — Other Ambulatory Visit: Payer: Self-pay

## 2020-04-01 ENCOUNTER — Encounter (HOSPITAL_COMMUNITY): Payer: Self-pay

## 2020-04-01 ENCOUNTER — Ambulatory Visit (HOSPITAL_COMMUNITY)
Admission: EM | Admit: 2020-04-01 | Discharge: 2020-04-01 | Disposition: A | Discharge status: Home / Self Care | Payer: Medicaid Other | Attending: Physician Assistant | Admitting: Physician Assistant

## 2020-04-01 DIAGNOSIS — M25461 Effusion, right knee: Secondary | ICD-10-CM | POA: Diagnosis not present

## 2020-04-01 DIAGNOSIS — M25561 Pain in right knee: Secondary | ICD-10-CM | POA: Diagnosis not present

## 2020-04-01 MED ORDER — NAPROXEN 500 MG PO TABS: 500.0000 mg | ORAL_TABLET | Freq: Two times a day (BID) | ORAL | 0 refills | Status: AC

## 2020-04-01 MED ORDER — DICLOFENAC SODIUM 1 % EX GEL: 4.0000 g | Freq: Four times a day (QID) | CUTANEOUS | 0 refills | Status: DC

## 2020-04-01 NOTE — ED Provider Notes (Signed)
[place]    CSN: [id number] Arrival date & time: 04/01/20  1354      History   Chief Complaint Chief Complaint  Patient presents with  . Knee Pain    HPI Diana Robinson is a 19 y.o. female.   Patient presents for recent history of right knee pain.  She reports last week she felt pain in her knee when walking to class.  This resolved after a few days but then returned yesterday when exercising.  She reports she was doing squats.  She reports pain is primary in the front of her knee.  She reports pain is worse after being rested for a while and standing up.  She reports it loosens up a little bit after walking.  Denies instability.  Denies falls, twisting or trauma.     Past Medical History:  Diagnosis Date  . Migraine 05/2019    Patient Active Problem List   Diagnosis Date Noted  . Elevated BP without diagnosis of hypertension 05/26/2019  . Frequent headaches 05/26/2019    History reviewed. No pertinent surgical history.  OB History    Gravida  0   Para  0   Term  0   Preterm  0   AB  0   Living  0     SAB  0   TAB  0   Ectopic  0   Multiple  0   Live Births  0            Home Medications    Prior to Admission medications   Medication Sig Start Date End Date Taking? Authorizing Provider  diclofenac Sodium (VOLTAREN) 1 % GEL Apply 4 g topically 4 (four) times daily. 04/01/20   Alyssamarie Mounsey, Marguerita Beards, PA-C  naproxen (NAPROSYN) 500 MG tablet Take 1 tablet (500 mg total) by mouth 2 (two) times daily for 10 days. 04/01/20 04/11/20  Nashon Erbes, Marguerita Beards, PA-C    Family History Family History  Problem Relation Age of Onset  . Healthy Mother   . Healthy Father     Social History Social History   Tobacco Use  . Smoking status: Never Smoker  . Smokeless tobacco: Never Used  Vaping Use  . Vaping Use: Never used  Substance Use Topics  . Alcohol use: Never  . Drug use: Never     Allergies   Patient has no known allergies.   Review of  Systems Review of Systems   Physical Exam Triage Vital Signs ED Triage Vitals  Enc Vitals Group     BP 04/01/20 1519 133/87     Pulse Rate 04/01/20 1519 90     Resp 04/01/20 1519 18     Temp 04/01/20 1519 98.6 F (37 C)     Temp Source 04/01/20 1519 Oral     SpO2 04/01/20 1519 100 %     Weight 04/01/20 1518 142 lb (64.4 kg)     Height --      Head Circumference --      Peak Flow --      Pain Score 04/01/20 1518 0     Pain Loc --      Pain Edu? --      Excl. in [place]? --    No data found.  Updated Vital Signs BP 133/87 (BP Location: Right Arm)   Pulse 90   Temp 98.6 F (37 C) (Oral)   Resp 18   Wt 142 lb (64.4 kg)   LMP 03/23/2020  SpO2 100%   BMI 23.63 kg/m   Visual Acuity Right Eye Distance:   Left Eye Distance:   Bilateral Distance:    Right Eye Near:   Left Eye Near:    Bilateral Near:     Physical Exam Vitals and nursing note reviewed.  Constitutional:      Appearance: Normal appearance.  Cardiovascular:     Rate and Rhythm: Normal rate.  Pulmonary:     Effort: Pulmonary effort is normal. No respiratory distress.  Musculoskeletal:     Comments: Right knee with mild infrapatellar effusion.  Mild tenderness peripatellar.  No pain with patellar compression.  Patella freely mobile.  No midline joint tenderness.  Full range of motion of the knee.  Stable to anterior and posterior drawer.  No crepitus on McMurray's.  Still pulses 2+.  Sensation intact.  Neurological:     Mental Status: She is alert.      UC Treatments / Results  Labs (all labs ordered are listed, but only abnormal results are displayed) Labs Reviewed - No data to display  EKG   Radiology No results found.  Procedures Procedures (including critical care time)  Medications Ordered in UC Medications - No data to display  Initial Impression / Assessment and Plan / UC Course  I have reviewed the triage vital signs and the nursing notes.  Pertinent labs & imaging results that  were available during my care of the patient were reviewed by me and considered in my medical decision making (see chart for details).     #Knee pain #Knee effusion Patient is a 19 year old presenting with knee effusion and knee pain.  Likely some aspect of patellofemoral versus bursitis.  Will trial on NSAIDs.  Will follow up with sports medicine as needed.  Discussed use of knee sleeves and ice after long days.  Patient verbalized agreement understanding plan of care Final Clinical Impressions(s) / UC Diagnoses   Final diagnoses:  Acute pain of right knee  Effusion of right knee     Discharge Instructions     Take the naproxen as prescribed -If improving you may consider using a topical Voltaren -Try a knee sleeve  Follow up with the sports medicine group as needed or your primary care.    ED Prescriptions    Medication Sig Dispense Auth. Provider   naproxen (NAPROSYN) 500 MG tablet Take 1 tablet (500 mg total) by mouth 2 (two) times daily for 10 days. 20 tablet Pricella Gaugh, Marguerita Beards, PA-C   diclofenac Sodium (VOLTAREN) 1 % GEL Apply 4 g topically 4 (four) times daily. 100 g Tanasia Budzinski, Marguerita Beards, PA-C     PDMP not reviewed this encounter.   Purnell Shoemaker, PA-C 04/01/20 1606

## 2020-04-01 NOTE — Discharge Instructions (Signed)
Take the naproxen as prescribed -If improving you may consider using a topical Voltaren -Try a knee sleeve  Follow up with the sports medicine group as needed or your primary care.

## 2020-04-01 NOTE — ED Triage Notes (Signed)
Pt is here with on & off right knee pain that started last Wednesday then stopped & started back yesterday during working out. Pt has not taken any meds to relieve discomfort.

## 2020-10-07 DIAGNOSIS — E559 Vitamin D deficiency, unspecified: Secondary | ICD-10-CM

## 2020-10-07 HISTORY — DX: Vitamin D deficiency, unspecified: E55.9

## 2020-10-24 ENCOUNTER — Encounter: Payer: Self-pay | Admitting: Family Medicine

## 2020-10-24 ENCOUNTER — Other Ambulatory Visit: Payer: Self-pay

## 2020-10-24 ENCOUNTER — Ambulatory Visit (INDEPENDENT_AMBULATORY_CARE_PROVIDER_SITE_OTHER): Payer: Medicaid Other | Admitting: Family Medicine

## 2020-10-24 VITALS — BP 137/85 | HR 82 | Ht 65.0 in | Wt 158.0 lb

## 2020-10-24 DIAGNOSIS — N6029 Fibroadenosis of unspecified breast: Secondary | ICD-10-CM

## 2020-10-24 DIAGNOSIS — Z09 Encounter for follow-up examination after completed treatment for conditions other than malignant neoplasm: Secondary | ICD-10-CM

## 2020-10-24 DIAGNOSIS — R519 Headache, unspecified: Secondary | ICD-10-CM

## 2020-10-24 DIAGNOSIS — N644 Mastodynia: Secondary | ICD-10-CM | POA: Diagnosis not present

## 2020-10-24 DIAGNOSIS — N946 Dysmenorrhea, unspecified: Secondary | ICD-10-CM

## 2020-10-24 DIAGNOSIS — Z0000 Encounter for general adult medical examination without abnormal findings: Secondary | ICD-10-CM

## 2020-10-24 MED ORDER — NAPROXEN 500 MG PO TABS: 500.0000 mg | ORAL_TABLET | Freq: Two times a day (BID) | ORAL | 3 refills | Status: DC

## 2020-10-24 NOTE — Progress Notes (Signed)
[facility]   Annual Physical   Subjective:  Patient ID: Diana Robinson, female    DOB: [birth year]  Age: 20 y.o. MRN: [id number]  CC:  Chief Complaint  Patient presents with  . Annual Exam   HPI Diana Robinson is a 20 year old female who presents for Annual Physical today.  Patient Active Problem List   Diagnosis Date Noted  . Elevated BP without diagnosis of hypertension 05/26/2019  . Frequent headaches 05/26/2019   Current Status: Since her last office visit, she has c/o increasing menstrual cramping, which she is taking Acetaminophen that is no longer effective. She also reports lumpy/tender breasts, with incidents of sharpe pain.She denies fevers, chills, fatigue, recent infections, weight loss, and night sweats. She has not had any headaches, visual changes, dizziness, and falls. No chest pain, heart palpitations, cough and shortness of breath reported. Denies GI problems such as nausea, vomiting, diarrhea, and constipation. She has no reports of blood in stools, dysuria and hematuria. She is taking all medications as prescribed. She denies pain today.   Past Medical History:  Diagnosis Date  . Migraine 05/2019  . Vitamin D deficiency 10/2020    History reviewed. No pertinent surgical history.  Family History  Problem Relation Age of Onset  . Healthy Mother   . Healthy Father     Social History   Socioeconomic History  . Marital status: Single    Spouse name: Not on file  . Number of children: Not on file  . Years of education: Not on file  . Highest education level: Not on file  Occupational History  . Not on file  Tobacco Use  . Smoking status: Never Smoker  . Smokeless tobacco: Never Used  Vaping Use  . Vaping Use: Never used  Substance and Sexual Activity  . Alcohol use: Never  . Drug use: Never  . Sexual activity: Not Currently    Birth control/protection: None  Other Topics Concern  . Not on file   Social History Narrative  . Not on file   Social Determinants of Health   Financial Resource Strain: Not on file  Food Insecurity: Not on file  Transportation Needs: Not on file  Physical Activity: Not on file  Stress: Not on file  Social Connections: Not on file  Intimate Partner Violence: Not on file    Outpatient Medications Prior to Visit  Medication Sig Dispense Refill  . acetaminophen (TYLENOL) 325 MG tablet Take 650 mg by mouth every 6 (six) hours as needed for moderate pain.    Marland Kitchen diclofenac Sodium (VOLTAREN) 1 % GEL Apply 4 g topically 4 (four) times daily. 100 g 0   No facility-administered medications prior to visit.    No Known Allergies  ROS Review of Systems  Constitutional: Negative.   HENT: Negative.   Eyes: Negative.   Respiratory: Negative.   Cardiovascular: Negative.   Gastrointestinal: Negative.   Endocrine: Negative.   Genitourinary: Negative.   Musculoskeletal: Negative.   Skin: Negative.   Allergic/Immunologic: Negative.   Neurological: Positive for dizziness (occasional ) and headaches (occasional ).  Hematological: Negative.   Psychiatric/Behavioral: Negative.    Objective:    Physical Exam Vitals and nursing note reviewed.  Constitutional:      Appearance: Normal appearance.  HENT:     Head: Normocephalic and atraumatic.     Nose: Nose normal.     Mouth/Throat:     Mouth: Mucous membranes are moist.  Pharynx: Oropharynx is clear.  Cardiovascular:     Rate and Rhythm: Normal rate and regular rhythm.     Pulses: Normal pulses.     Heart sounds: Normal heart sounds.  Pulmonary:     Effort: Pulmonary effort is normal.     Breath sounds: Normal breath sounds.  Chest:    Abdominal:     General: Bowel sounds are normal.     Palpations: Abdomen is soft.  Musculoskeletal:        General: Normal range of motion.     Cervical back: Normal range of motion and neck supple.  Skin:    General: Skin is warm and dry.  Neurological:      General: No focal deficit present.     Mental Status: She is alert and oriented to person, place, and time.  Psychiatric:        Mood and Affect: Mood normal.        Behavior: Behavior normal.        Thought Content: Thought content normal.        Judgment: Judgment normal.    BP 137/85   Pulse 82   Ht 5\' 5"  (1.651 m)   Wt 158 lb (71.7 kg)   LMP 10/01/2020   SpO2 99%   BMI 26.29 kg/m  Wt Readings from Last 3 Encounters:  10/24/20 158 lb (71.7 kg)  04/01/20 142 lb (64.4 kg)  06/25/19 139 lb (63 kg) (68 %, Z= 0.48)*   * Growth percentiles are based on CDC (Girls, 2-20 Years) data.     Health Maintenance Due  Topic Date Due  . Hepatitis C Screening  Never done  . HPV VACCINES (1 - 2-dose series) Never done  . HIV Screening  Never done  . TETANUS/TDAP  Never done       Topic Date Due  . HPV VACCINES (1 - 2-dose series) Never done    No results found for: TSH Lab Results  Component Value Date   WBC 5.1 10/24/2020   HGB 12.2 10/24/2020   HCT 38.6 10/24/2020   MCV 83 10/24/2020   PLT 249 10/24/2020   Lab Results  Component Value Date   NA 138 10/24/2020   K 4.2 10/24/2020   CO2 20 10/24/2020   GLUCOSE 91 10/24/2020   BUN 8 10/24/2020   CREATININE 0.94 10/24/2020   BILITOT 0.5 10/24/2020   ALKPHOS 125 (H) 10/24/2020   AST 20 10/24/2020   ALT 12 10/24/2020   PROT 7.6 10/24/2020   ALBUMIN 4.4 10/24/2020   CALCIUM 9.6 10/24/2020   No results found for: CHOL No results found for: HDL No results found for: LDLCALC No results found for: TRIG No results found for: CHOLHDL No results found for: HGBA1C   Assessment & Plan:   1. Annual physical exam Physical assessment within normal for age. Basic Neurology assessment normal.  Follow-up for scheduled mammogram as needed.  Recommend monthly self breast exam Recommend daily multivitamin for women Recommend strength training in 150 minutes of cardiovascular exercise per week - CBC with Differential -  Comprehensive metabolic panel - Vitamin Y19 - Vitamin D, 25-hydroxy  2. Dysmenorrhea  3. Menstrual cramps - naproxen (NAPROSYN) 500 MG tablet; Take 1 tablet (500 mg total) by mouth 2 (two) times daily with a meal.  Dispense: 30 tablet; Refill: 3  4. Frequent headaches Stable today. Monitor.   5. Soreness breast - MM 3D SCREEN BREAST BILATERAL; Future  6. Breast pain, right - MM 3D  SCREEN BREAST BILATERAL; Future  7. Lumpy breasts, unspecified laterality - MM 3D SCREEN BREAST BILATERAL; Future  8. Follow up She will follow up in 1 year for Annual Physical and Labwork.   Meds ordered this encounter  Medications  . naproxen (NAPROSYN) 500 MG tablet    Sig: Take 1 tablet (500 mg total) by mouth 2 (two) times daily with a meal.    Dispense:  30 tablet    Refill:  3    Orders Placed This Encounter  Procedures  . MM 3D SCREEN BREAST BILATERAL  . CBC with Differential  . Comprehensive metabolic panel  . Vitamin B12  . Vitamin D, 25-hydroxy    Referral Orders  No referral(s) requested today    Kathe Becton, MSN, ANE, FNP-BC [facility] [place], [place] [address] [phone]- fax   Problem List Items Addressed This Visit      Other   Frequent headaches   Relevant Medications   naproxen (NAPROSYN) 500 MG tablet   acetaminophen (TYLENOL) 325 MG tablet    Other Visit Diagnoses    Annual physical exam    -  Primary   Relevant Orders   CBC with Differential (Completed)   Comprehensive metabolic panel (Completed)   Vitamin B12 (Completed)   Vitamin D, 25-hydroxy (Completed)   Dysmenorrhea       Menstrual cramps       Relevant Medications   naproxen (NAPROSYN) 500 MG tablet   Soreness breast       Relevant Orders   MM 3D SCREEN BREAST BILATERAL   Breast pain, right       Relevant Orders   MM 3D SCREEN BREAST BILATERAL   Lumpy breasts,  unspecified laterality       Relevant Orders   MM 3D SCREEN BREAST BILATERAL   Follow up          Meds ordered this encounter  Medications  . naproxen (NAPROSYN) 500 MG tablet    Sig: Take 1 tablet (500 mg total) by mouth 2 (two) times daily with a meal.    Dispense:  30 tablet    Refill:  3    Follow-up: No follow-ups on file.    Azzie Glatter, FNP

## 2020-10-24 NOTE — Patient Instructions (Signed)
Dysmenorrhea Dysmenorrhea means cramps during your period (menstrual period) that cause pain in your lower belly (abdomen). The pain is caused by the tightening (contracting) of the muscles of the womb (uterus). The pain may be mild or very bad. Primary dysmenorrhea is cramps that last a couple of days when a woman starts having periods or soon after. As a woman gets older or has a baby, the cramps will usually lessen or disappear. Secondary dysmenorrhea begins later in life and is caused by other problems. What are the causes? This condition may be caused by problems with the:  Tissue that lines the womb. This tissue may grow: ? Outside of the womb. ? Into the walls of the womb.  Blood vessels in the area between your hip bones (pelvis).  Tissue in the lower part of the womb (cervix), including growths (polyps).  Muscles that hold up the womb.  Bladder.  Bowels. It can also be caused by cancer. Other causes include:  A very tipped womb.  The lower part of the womb having a small opening.  Tumors in the womb that are not cancer.  Pelvic inflammatory disease (PID).  Scars from surgeries you have had.  A cyst in the ovaries.  An IUD (intrauterine device). What increases the risk?  Being younger than age 82.  Having started puberty early.  Having irregular bleeding or heavy bleeding.  Never having given birth.  Having a family history of period cramps.  Smoking or using products with nicotine.  Having a high body weight or a low body weight. What are the signs or symptoms?  Cramps and pain in the lower belly or lower back.  A feeling of fullness in the lower belly.  Periods lasting for longer than 7 days.  Headaches.  Bloating.  Tiredness (fatigue).  Feeling like you may vomit (nauseous) or vomiting.  Watery poop (diarrhea) or loose poop (stool).  Sweating.  Dizziness. How is this treated? Treatment depends on the cause of the cramps. Treatment may  include medicines, such as:  Medicines for pain.  Medicines for bleeding.  Body chemical (hormone) replacement therapy. ? Shots (injections) to stop the menstrual period. ? Birth control pills. ? An IUD.  NSAIDs, such as ibuprofen. Other treatments may include:  Surgeries.  Procedures.  Nerve stimulation.  Doing exercises.  Yoga and alternative treatments. Work with your doctor to find what treatments are best for you. Follow these instructions at home: Helping pain and cramping  If told, put heat on your lower back or belly when you have pain or cramps. Do this as often as told by your doctor. Use the heat source that your doctor recommends, such as a moist heat pack or a heating pad. ? Place a towel between your skin and the heat. ? Leave the heat on for 20-30 minutes. ? Take off the heat if your skin turns bright red. This is very important. If you cannot feel pain, heat, or cold, you have a greater risk of getting burned.  Do not sleep with a heating pad.  Exercise. Walking, swimming, or biking can help take away cramps.  Massage your lower back or belly. This may help lessen pain.   General instructions  Take over-the-counter and prescription medicines only as told by your doctor.  Ask your doctor if you should avoid driving or using machines while you are taking your medicine.  Avoid alcohol and caffeine during and right before your period. These can make cramps worse.  Do not  smoke or use any products that contain nicotine or tobacco. If you need help quitting, ask your doctor.  Keep all follow-up visits. Contact a doctor if:  You have pain that gets worse.  You have pain that does not get better with medicine.  You have pain during sex.  You feel like you may vomit or you vomit during your period and medicine does not help. Get help right away if:  You faint. Summary  Dysmenorrhea means painful cramps during your period.  Put heat on your lower  back or belly when you have pain or cramps.  Do exercises like walking, swimming, or biking to help with cramps.  Contact a doctor if you have pain during sex. This information is not intended to replace advice given to you by your health care provider. Make sure you discuss any questions you have with your health care provider. Document Revised: 03/12/2020 Document Reviewed: 03/12/2020 Elsevier Patient Education  2021 [place]. Sumatriptan; Naproxen Oral Tablets What is this medicine? SUMATRIPTAN; NAPROXEN (soo ma TRIP tan; na PROS en) is used to treat migraines with or without aura. An aura is a strange feeling or visual disturbance that warns you of an attack. This medicine is not used to prevent migraines. This medicine may be used for other purposes; ask your health care provider or pharmacist if you have questions. COMMON BRAND NAME(S): Treximet What should I tell my health care provider before I take this medicine? They need to know if you have any of these conditions:  cigarette smoker  circulation problems in fingers and toes  coronary artery bypass graft (CABG) surgery within the past 2 weeks  diabetes  drink more than 3 alcohol-containing drinks per day  heart disease  high blood pressure  high cholesterol  history of irregular heartbeat  history of stomach bleeding  history of stroke  kidney disease  liver disease  lung or breathing disease, like asthma  stomach or intestine problems  an unusual or allergic reaction to sumatriptan, naproxen, aspirin, other NSAIDs, other medicines, foods, dyes, or preservatives  pregnant or trying to get pregnant  breast-feeding How should I use this medicine? Take this medicine by mouth with a glass of water. Follow the directions on the prescription label. You can take it with or without food. If it upsets your stomach, take it with food. Do not cut, crush, or chew this medicine. Do not take it more often than  directed. A special MedGuide will be given to you by the pharmacist with each prescription and refill. Be sure to read this information carefully each time. Talk to your pediatrician regarding the use of this medicine in children. While this drug may be prescribed for children as young as 12 for selected conditions, precautions do apply. Overdosage: If you think you have taken too much of this medicine contact a poison control center or emergency room at once. NOTE: This medicine is only for you. Do not share this medicine with others. What if I miss a dose? This does not apply. This medicine is not for regular use. What may interact with this medicine? Do not take this medicine with any of the following medicines:  certain medicines for migraine headache like almotriptan, eletriptan, frovatriptan, naratriptan, rizatriptan, sumatriptan, zolmitriptan  cidofovir  ergot alkaloids like dihydroergotamine, ergonovine, ergotamine, methylergonovine  ketorolac  MAOIs like Carbex, Eldepryl, Marplan, Nardil, and Parnate This medicine may also interact with the following medications:  aspirin  certain medicines for blood pressure, heart disease,  irregular heart beat  certain medicines for depression, anxiety, or psychotic disorders  certain medicines that treat or prevent blood clots like warfarin, enoxaparin, dalteparin, apixaban, dabigatran, and rivaroxaban  cyclosporine  methotrexate  NSAIDs, medicines for pain and inflammation, like ibuprofen or naproxen  pemetrexed  probenecid This list may not describe all possible interactions. Give your health care provider a list of all the medicines, herbs, non-prescription drugs, or dietary supplements you use. Also tell them if you smoke, drink alcohol, or use illegal drugs. Some items may interact with your medicine. What should I watch for while using this medicine? Visit your health care provider for regular checks on your progress. Tell  your health care provider if your symptoms do not start to get better or if they get worse. This medicine may cause serious skin reactions. They can happen weeks to months after starting the medicine. Contact your health care provider right away if you notice fevers or flu-like symptoms with a rash. The rash may be red or purple and then turn into blisters or peeling of the skin. Or, you might notice a red rash with swelling of the face, lips or lymph nodes in your neck or under your arms. Talk to your health care provider if you are pregnant before taking this medicine. Taking this medicine between weeks 20 and 30 of pregnancy may harm your unborn baby. Your health care provider will monitor you closely if you need to take it. After 30 weeks of pregnancy, do not take this medicine. You may get drowsy or dizzy. Do not drive, use machinery, or do anything that needs mental alertness until you know how this medicine affects you. Do not stand up or sit up quickly, especially if you are an older patient. This reduces the risk of dizzy or fainting spells. Alcohol may interfere with the effect of this medicine. Avoid alcoholic drinks. Do not take other medicines that contain aspirin, ibuprofen, or naproxen with this medicine. Side effects such as stomach upset, nausea, or ulcers may be more likely to occur. Many non-prescription medicines contain aspirin, ibuprofen, or naproxen. Always read labels carefully. This medicine can cause ulcers and bleeding in the stomach and intestines at any time during treatment. This can happen with no warning and may cause death. Smoking, drinking alcohol, older age, and poor health can also increase risks. Call your healthcare provider right away if you have stomach pain or blood in your vomit or stool. This medicine does not prevent a heart attack or stroke. This medicine may increase the chance of a heart attack or stroke. The chance may increase the longer you use this medicine  or if you have heart disease. If you take aspirin to prevent a heart attack or stroke, talk to your health care provider about using this medicine. This medicine may increase your risk to bruise or bleed. Call your health care provider if you notice any unusual bleeding. Tell your healthcare provider right away if you have any change in your eyesight. Your mouth may get dry. Chewing sugarless gum or sucking hard candy and drinking plenty of water may help. Contact your health care provider if the problem does not go away or is severe. If you take migraine medicines for 10 or more days a month, your migraines may get worse. Keep a diary of headache days and medicine use. Contact your health care provider if your migraine attacks occur more frequently. What side effects may I notice from receiving this medicine? Side effects  that you should report to your doctor or health care provider as soon as possible:  allergic reactions (skin rash, itching or hives; swelling of the face, lips, or tongue)  bleeding (bloody or black, tarry stools; red or dark brown urine; spitting up blood or brown material that looks like coffee grounds; red spots on the skin; unusual bruising or bleeding from the eyes, gums, or nose)  blood clot (chest pain; shortness of breath; pain, swelling, or warmth in the leg)  heart failure (trouble breathing; fast, irregular heartbeat; sudden weight gain; swelling of the ankles, feet, hands; unusually weak or tired)  kidney injury (trouble passing urine or change in the amount of urine)  light-colored stool  liver injury (dark yellow or brown urine; general ill feeling or flu-like symptoms; loss of appetite, right upper belly pain; unusually weak or tired, yellowing of the eyes or skin)  low red blood cell counts (trouble breathing; feeling faint; lightheaded, falls; unusually weak or tired)  rash, fever, and swollen lymph nodes  redness, blistering, peeling, or loosening of the  skin, including inside the mouth  stroke (changes in vision; confusion; trouble speaking or understanding; severe headaches; sudden numbness or weakness of the face, arm or leg; trouble walking; dizziness; loss of balance or coordination) Side effects that usually do not require medical attention (report to your doctor or health care provider if they continue or are bothersome):  constipation  diarrhea  dizziness  headache  nausea, vomiting  passing gas  trouble sleeping This list may not describe all possible side effects. Call your doctor for medical advice about side effects. You may report side effects to FDA at 1-800-FDA-1088. Where should I keep my medicine? Keep out of the reach of children and pets. Store at room temperature between 15 and 30 degrees C (59 and 86 degrees F). Get rid of any unused medicine after the expiration date. To get rid of medicines that are no longer needed or have expired:  Take the medicine to a medicine take-back program. Check with your pharmacy or law enforcement to find a location.  If you cannot return the medicine, check the label or package insert to see if the medicine should be thrown out in the garbage or flushed down the toilet. If you are not sure, ask your health care provider. If it is safe to put it in the trash, empty the medicine out of the container. Mix the medicine with cat litter, dirt, coffee grounds, or other unwanted substance. Seal the mixture in a bag or container. Put it in the trash. NOTE: This sheet is a summary. It may not cover all possible information. If you have questions about this medicine, talk to your doctor, pharmacist, or health care provider.  2021 Elsevier/Gold Standard (2019-12-21 17:25:24)

## 2020-10-25 ENCOUNTER — Encounter: Payer: Self-pay | Admitting: Family Medicine

## 2020-10-25 ENCOUNTER — Other Ambulatory Visit: Payer: Self-pay | Admitting: Family Medicine

## 2020-10-25 DIAGNOSIS — E559 Vitamin D deficiency, unspecified: Secondary | ICD-10-CM

## 2020-10-25 LAB — CBC WITH DIFFERENTIAL/PLATELET
Basophils Absolute: 0 10*3/uL (ref 0.0–0.2)
Basos: 1 %
EOS (ABSOLUTE): 0 10*3/uL (ref 0.0–0.4)
Eos: 1 %
Hematocrit: 38.6 % (ref 34.0–46.6)
Hemoglobin: 12.2 g/dL (ref 11.1–15.9)
Immature Grans (Abs): 0 10*3/uL (ref 0.0–0.1)
Immature Granulocytes: 0 %
Lymphocytes Absolute: 1.8 10*3/uL (ref 0.7–3.1)
Lymphs: 35 %
MCH: 26.1 pg — ABNORMAL LOW (ref 26.6–33.0)
MCHC: 31.6 g/dL (ref 31.5–35.7)
MCV: 83 fL (ref 79–97)
Monocytes Absolute: 0.5 10*3/uL (ref 0.1–0.9)
Monocytes: 9 %
Neutrophils Absolute: 2.7 10*3/uL (ref 1.4–7.0)
Neutrophils: 54 %
Platelets: 249 10*3/uL (ref 150–450)
RBC: 4.68 x10E6/uL (ref 3.77–5.28)
RDW: 14.2 % (ref 11.7–15.4)
WBC: 5.1 10*3/uL (ref 3.4–10.8)

## 2020-10-25 LAB — COMPREHENSIVE METABOLIC PANEL
ALT: 12 IU/L (ref 0–32)
AST: 20 IU/L (ref 0–40)
Albumin/Globulin Ratio: 1.4 (ref 1.2–2.2)
Albumin: 4.4 g/dL (ref 3.9–5.0)
Alkaline Phosphatase: 125 IU/L — ABNORMAL HIGH (ref 42–106)
BUN/Creatinine Ratio: 9 (ref 9–23)
BUN: 8 mg/dL (ref 6–20)
Bilirubin Total: 0.5 mg/dL (ref 0.0–1.2)
CO2: 20 mmol/L (ref 20–29)
Calcium: 9.6 mg/dL (ref 8.7–10.2)
Chloride: 104 mmol/L (ref 96–106)
Creatinine, Ser: 0.94 mg/dL (ref 0.57–1.00)
Globulin, Total: 3.2 g/dL (ref 1.5–4.5)
Glucose: 91 mg/dL (ref 65–99)
Potassium: 4.2 mmol/L (ref 3.5–5.2)
Sodium: 138 mmol/L (ref 134–144)
Total Protein: 7.6 g/dL (ref 6.0–8.5)
eGFR: 89 mL/min/{1.73_m2} (ref >59)

## 2020-10-25 LAB — VITAMIN B12: Vitamin B-12: 327 pg/mL (ref 232–1245)

## 2020-10-25 LAB — VITAMIN D 25 HYDROXY (VIT D DEFICIENCY, FRACTURES): Vit D, 25-Hydroxy: 7.7 ng/mL — ABNORMAL LOW (ref 30.0–100.0)

## 2020-10-25 MED ORDER — VITAMIN D (ERGOCALCIFEROL) 1.25 MG (50000 UNIT) PO CAPS: 50000.0000 [IU] | ORAL_CAPSULE | ORAL | 6 refills | Status: DC

## 2020-12-08 ENCOUNTER — Other Ambulatory Visit: Payer: Self-pay

## 2020-12-08 ENCOUNTER — Encounter (HOSPITAL_COMMUNITY): Payer: Self-pay

## 2020-12-08 ENCOUNTER — Ambulatory Visit (HOSPITAL_COMMUNITY)
Admission: EM | Admit: 2020-12-08 | Discharge: 2020-12-08 | Disposition: A | Discharge status: Home / Self Care | Payer: Medicaid Other | Attending: Emergency Medicine | Admitting: Emergency Medicine

## 2020-12-08 DIAGNOSIS — M778 Other enthesopathies, not elsewhere classified: Secondary | ICD-10-CM

## 2020-12-08 MED ORDER — DICLOFENAC SODIUM 75 MG PO TBEC: 75.0000 mg | DELAYED_RELEASE_TABLET | Freq: Two times a day (BID) | ORAL | 0 refills | Status: AC

## 2020-12-08 NOTE — ED Triage Notes (Signed)
Pt reports stiffness, numbness and discomfort in the right ring finger x 1 week. Denies any injury.

## 2020-12-08 NOTE — ED Provider Notes (Signed)
[place]    CSN: [id number] Arrival date & time: 12/08/20  0942      History   Chief Complaint Chief Complaint  Patient presents with  . finger problem    HPI Diana Robinson is a 20 y.o. female.   Patient presents with right ring finger pain. She reports about a week ago, after braiding her hair, she notices pain in her right ring finger at the proximal joint whenever she tried to bend it or make a fist. She denies any injury to the area. The patient is right-handed. She has applied ice and taken Tylenol with minimal improvement. She reports at times she has some numbness sensation to the joint area but not to the tip of her finger. She denies prior similar. The patient has no pain at rest, only with bending that joint.   The history is provided by the patient.    Past Medical History:  Diagnosis Date  . Migraine 05/2019  . Vitamin D deficiency 10/2020    Patient Active Problem List   Diagnosis Date Noted  . Elevated BP without diagnosis of hypertension 05/26/2019  . Frequent headaches 05/26/2019    History reviewed. No pertinent surgical history.  OB History    Gravida  0   Para  0   Term  0   Preterm  0   AB  0   Living  0     SAB  0   IAB  0   Ectopic  0   Multiple  0   Live Births  0            Home Medications    Prior to Admission medications   Medication Sig Start Date End Date Taking? Authorizing Provider  diclofenac (VOLTAREN) 75 MG EC tablet Take 1 tablet (75 mg total) by mouth 2 (two) times daily for 10 days. 12/08/20 12/18/20 Yes Hans Rusher L, PA  acetaminophen (TYLENOL) 325 MG tablet Take 650 mg by mouth every 6 (six) hours as needed for moderate pain.    [provider]  naproxen (NAPROSYN) 500 MG tablet Take 1 tablet (500 mg total) by mouth 2 (two) times daily with a meal. 10/24/20   Azzie Glatter, FNP  Vitamin D, Ergocalciferol, (DRISDOL) 1.25 MG (50000 UNIT) CAPS capsule Take 1 capsule (50,000 Units  total) by mouth every 7 (seven) days. 10/25/20   Azzie Glatter, FNP    Family History Family History  Problem Relation Age of Onset  . Healthy Mother   . Healthy Father     Social History Social History   Tobacco Use  . Smoking status: Never Smoker  . Smokeless tobacco: Never Used  Vaping Use  . Vaping Use: Never used  Substance Use Topics  . Alcohol use: Never  . Drug use: Never     Allergies   Patient has no known allergies.   Review of Systems Review of Systems  Constitutional: Negative for fever.  Musculoskeletal: Positive for arthralgias. Negative for joint swelling and myalgias.  Skin: Negative for color change, rash and wound.  Neurological: Positive for numbness. Negative for weakness.     Physical Exam Triage Vital Signs ED Triage Vitals  Enc Vitals Group     BP 12/08/20 1006 (!) 130/91     Pulse Rate 12/08/20 1006 80     Resp 12/08/20 1006 16     Temp 12/08/20 1006 97.8 F (36.6 C)     Temp Source 12/08/20 1006 Oral  SpO2 12/08/20 1006 100 %     Weight --      Height --      Head Circumference --      Peak Flow --      Pain Score 12/08/20 1005 8     Pain Loc --      Pain Edu? --      Excl. in [place]? --    No data found.  Updated Vital Signs BP (!) 130/91 (BP Location: Right Arm)   Pulse 80   Temp 97.8 F (36.6 C) (Oral)   Resp 16   LMP 11/23/2020 (Exact Date)   SpO2 100%   Visual Acuity Right Eye Distance:   Left Eye Distance:   Bilateral Distance:    Right Eye Near:   Left Eye Near:    Bilateral Near:     Physical Exam Vitals and nursing note reviewed.  Constitutional:      General: She is not in acute distress. HENT:     Head: Normocephalic.  Eyes:     Pupils: Pupils are equal, round, and reactive to light.  Cardiovascular:     Rate and Rhythm: Normal rate and regular rhythm.     Pulses: Normal pulses.     Heart sounds: Normal heart sounds.  Pulmonary:     Effort: Pulmonary effort is normal.     Breath sounds:  Normal breath sounds.  Musculoskeletal:     Right hand: No swelling or tenderness. Normal range of motion. Normal strength. Normal sensation. Normal capillary refill.     Comments: Patient points to right 4th PIP as area of pain. No tenderness to entire PIP or 4th digit or back into hand. No swelling, erythema, or warmth. No trigger finger or palpable nodule to palm below base of 4th digit. Full ROM, reports discomfort with full tight flexion of right 4th PIP   Neurological:     Mental Status: She is alert.     Sensory: No sensory deficit.     Motor: No weakness.  Psychiatric:        Mood and Affect: Mood normal.      UC Treatments / Results  Labs (all labs ordered are listed, but only abnormal results are displayed) Labs Reviewed - No data to display  EKG   Radiology No results found.  Procedures Procedures (including critical care time)  Medications Ordered in UC Medications - No data to display  Initial Impression / Assessment and Plan / UC Course  I have reviewed the triage vital signs and the nursing notes.  Pertinent labs & imaging results that were available during my care of the patient were reviewed by me and considered in my medical decision making (see chart for details).     Consistent with tendonitis, likely due to repetitive tight gripping from braiding small braids. NSAIDs, conservative tx, follow up if no improvement.  Final Clinical Impressions(s) / UC Diagnoses   Final diagnoses:  Tendonitis of finger     Discharge Instructions     Symptoms consistent with tendonitis. Take diclofenac as prescribed (do not take with naproxen or ibuprofen), and can continue Tylenol as needed. Limit exacerbating movements and do gentle range of motion exercises, and can continue applying ice. Follow up with hand specialist if no improvement in a week.     ED Prescriptions    Medication Sig Dispense Auth. Provider   diclofenac (VOLTAREN) 75 MG EC tablet Take 1  tablet (75 mg total) by mouth 2 (two) times  daily for 10 days. 20 tablet Abner Greenspan, Harleigh Civello L, PA     PDMP not reviewed this encounter.   Delsa Sale, [place] 12/08/20 1105

## 2020-12-08 NOTE — Discharge Instructions (Addendum)
Symptoms consistent with tendonitis. Take diclofenac as prescribed (do not take with naproxen or ibuprofen), and can continue Tylenol as needed. Limit exacerbating movements and do gentle range of motion exercises, and can continue applying ice. Follow up with hand specialist if no improvement in a week.

## 2021-04-25 ENCOUNTER — Other Ambulatory Visit: Payer: Self-pay

## 2021-04-25 ENCOUNTER — Emergency Department (HOSPITAL_COMMUNITY)
Admission: EM | Admit: 2021-04-25 | Discharge: 2021-04-26 | Disposition: A | Discharge status: Home / Self Care | Payer: Medicaid Other | Attending: Emergency Medicine | Admitting: Emergency Medicine

## 2021-04-25 DIAGNOSIS — R300 Dysuria: Secondary | ICD-10-CM | POA: Insufficient documentation

## 2021-04-25 DIAGNOSIS — R112 Nausea with vomiting, unspecified: Secondary | ICD-10-CM | POA: Diagnosis not present

## 2021-04-25 DIAGNOSIS — N9489 Other specified conditions associated with female genital organs and menstrual cycle: Secondary | ICD-10-CM | POA: Diagnosis not present

## 2021-04-25 DIAGNOSIS — Z20822 Contact with and (suspected) exposure to covid-19: Secondary | ICD-10-CM | POA: Diagnosis not present

## 2021-04-25 DIAGNOSIS — R519 Headache, unspecified: Secondary | ICD-10-CM | POA: Diagnosis not present

## 2021-04-25 DIAGNOSIS — D72829 Elevated white blood cell count, unspecified: Secondary | ICD-10-CM | POA: Diagnosis not present

## 2021-04-25 DIAGNOSIS — R82998 Other abnormal findings in urine: Secondary | ICD-10-CM | POA: Insufficient documentation

## 2021-04-25 LAB — LIPASE, BLOOD: Lipase: 41 U/L (ref 11–51)

## 2021-04-25 LAB — CBC WITH DIFFERENTIAL/PLATELET
Abs Immature Granulocytes: 0.01 10*3/uL (ref 0.00–0.07)
Basophils Absolute: 0 10*3/uL (ref 0.0–0.1)
Basophils Relative: 1 %
Eosinophils Absolute: 0 10*3/uL (ref 0.0–0.5)
Eosinophils Relative: 0 %
HCT: 35.5 % — ABNORMAL LOW (ref 36.0–46.0)
Hemoglobin: 11.4 g/dL — ABNORMAL LOW (ref 12.0–15.0)
Immature Granulocytes: 0 %
Lymphocytes Relative: 43 %
Lymphs Abs: 2.6 10*3/uL (ref 0.7–4.0)
MCH: 26.2 pg (ref 26.0–34.0)
MCHC: 32.1 g/dL (ref 30.0–36.0)
MCV: 81.6 fL (ref 80.0–100.0)
Monocytes Absolute: 0.6 10*3/uL (ref 0.1–1.0)
Monocytes Relative: 11 %
Neutro Abs: 2.8 10*3/uL (ref 1.7–7.7)
Neutrophils Relative %: 45 %
Platelets: 254 10*3/uL (ref 150–400)
RBC: 4.35 MIL/uL (ref 3.87–5.11)
RDW: 14.5 % (ref 11.5–15.5)
WBC: 6.1 10*3/uL (ref 4.0–10.5)
nRBC: 0 % (ref 0.0–0.2)

## 2021-04-25 LAB — COMPREHENSIVE METABOLIC PANEL
ALT: 15 U/L (ref 0–44)
AST: 19 U/L (ref 15–41)
Albumin: 3.9 g/dL (ref 3.5–5.0)
Alkaline Phosphatase: 83 U/L (ref 38–126)
Anion gap: 7 (ref 5–15)
BUN: 14 mg/dL (ref 6–20)
CO2: 24 mmol/L (ref 22–32)
Calcium: 9 mg/dL (ref 8.9–10.3)
Chloride: 106 mmol/L (ref 98–111)
Creatinine, Ser: 0.81 mg/dL (ref 0.44–1.00)
GFR, Estimated: >60 mL/min (ref >60)
Glucose, Bld: 99 mg/dL (ref 70–99)
Potassium: 3.4 mmol/L — ABNORMAL LOW (ref 3.5–5.1)
Sodium: 137 mmol/L (ref 135–145)
Total Bilirubin: 0.6 mg/dL (ref 0.3–1.2)
Total Protein: 7.6 g/dL (ref 6.5–8.1)

## 2021-04-25 LAB — I-STAT BETA HCG BLOOD, ED (MC, WL, AP ONLY): I-stat hCG, quantitative: <5 m[IU]/mL (ref <5)

## 2021-04-25 MED ORDER — ONDANSETRON HCL 4 MG/2ML IJ SOLN
4.0000 mg | Freq: Once | INTRAMUSCULAR | Status: AC
  Administered 2021-04-25: 4 mg via INTRAVENOUS
  Filled 2021-04-25: qty 2

## 2021-04-25 MED ORDER — SODIUM CHLORIDE 0.9 % IV BOLUS
1000.0000 mL | Freq: Once | INTRAVENOUS | Status: AC
  Administered 2021-04-25: 1000 mL via INTRAVENOUS

## 2021-04-25 NOTE — ED Notes (Signed)
Pt has a urine specimen cup and is aware a specimen is needed.

## 2021-04-25 NOTE — ED Triage Notes (Signed)
Pt came in with c/o headache and weakness times three days. Vomiting started today. Pt states she has thrown up four times

## 2021-04-26 LAB — URINALYSIS, ROUTINE W REFLEX MICROSCOPIC
Bacteria, UA: NONE SEEN
Bilirubin Urine: NEGATIVE
Glucose, UA: NEGATIVE mg/dL
Hgb urine dipstick: NEGATIVE
Ketones, ur: NEGATIVE mg/dL
Nitrite: NEGATIVE
Protein, ur: NEGATIVE mg/dL
Specific Gravity, Urine: 1.01 (ref 1.005–1.030)
pH: 6 (ref 5.0–8.0)

## 2021-04-26 LAB — RESP PANEL BY RT-PCR (FLU A&B, COVID) ARPGX2
Influenza A by PCR: NEGATIVE
Influenza B by PCR: NEGATIVE
SARS Coronavirus 2 by RT PCR: NEGATIVE

## 2021-04-26 MED ORDER — KETOROLAC TROMETHAMINE 30 MG/ML IJ SOLN
30.0000 mg | Freq: Once | INTRAMUSCULAR | Status: AC
  Administered 2021-04-26: 30 mg via INTRAVENOUS
  Filled 2021-04-26: qty 1

## 2021-04-26 MED ORDER — DIPHENHYDRAMINE HCL 50 MG/ML IJ SOLN
25.0000 mg | Freq: Once | INTRAMUSCULAR | Status: AC
  Administered 2021-04-26: 25 mg via INTRAVENOUS
  Filled 2021-04-26: qty 1

## 2021-04-26 MED ORDER — ONDANSETRON 4 MG PO TBDP: 4.0000 mg | ORAL_TABLET | Freq: Three times a day (TID) | ORAL | 0 refills | Status: DC | PRN

## 2021-04-26 MED ORDER — METOCLOPRAMIDE HCL 5 MG/ML IJ SOLN
10.0000 mg | Freq: Once | INTRAMUSCULAR | Status: AC
  Administered 2021-04-26: 10 mg via INTRAVENOUS
  Filled 2021-04-26: qty 2

## 2021-04-26 NOTE — ED Provider Notes (Signed)
[facility] DEPT Provider Note   CSN: [id number] Arrival date & time: 04/25/21  2138     History Chief Complaint  Patient presents with   Emesis   Headache    Diana Robinson is a 20 y.o. female.  Patient presents to the emergency department with a chief complaint of nausea, vomiting, headache.  She has had the symptoms for the past 3 days.  She reports a few episodes of vomiting.  She also reports some mild dysuria.  She denies any abdominal pain.  Denies diarrhea.  Denies any fevers or chills.  Denies cough.  Denies any successful treatments prior to arrival.  The history is provided by the patient. No language interpreter was used.      Past Medical History:  Diagnosis Date   Migraine 05/2019   Vitamin D deficiency 10/2020    Patient Active Problem List   Diagnosis Date Noted   Elevated BP without diagnosis of hypertension 05/26/2019   Frequent headaches 05/26/2019    No past surgical history on file.   OB History     Gravida  0   Para  0   Term  0   Preterm  0   AB  0   Living  0      SAB  0   IAB  0   Ectopic  0   Multiple  0   Live Births  0           Family History  Problem Relation Age of Onset   Healthy Mother    Healthy Father     Social History   Tobacco Use   Smoking status: Never   Smokeless tobacco: Never  Vaping Use   Vaping Use: Never used  Substance Use Topics   Alcohol use: Never   Drug use: Never    Home Medications Prior to Admission medications   Medication Sig Start Date End Date Taking? Authorizing Provider  ondansetron (ZOFRAN ODT) 4 MG disintegrating tablet Take 1 tablet (4 mg total) by mouth every 8 (eight) hours as needed for nausea or vomiting. 04/26/21  Yes Montine Circle, PA-C  acetaminophen (TYLENOL) 325 MG tablet Take 650 mg by mouth every 6 (six) hours as needed for moderate pain.    [provider]  naproxen (NAPROSYN) 500 MG tablet Take 1 tablet (500 mg  total) by mouth 2 (two) times daily with a meal. 10/24/20   Azzie Glatter, FNP  Vitamin D, Ergocalciferol, (DRISDOL) 1.25 MG (50000 UNIT) CAPS capsule Take 1 capsule (50,000 Units total) by mouth every 7 (seven) days. 10/25/20   Azzie Glatter, FNP    Allergies    Patient has no known allergies.  Review of Systems   Review of Systems  All other systems reviewed and are negative.  Physical Exam Updated Vital Signs BP (!) 143/103   Pulse 75   Temp 98.7 F (37.1 C)   Resp 15   Ht '5\' 5"'$  (1.651 m)   Wt 67.1 kg   SpO2 98%   BMI 24.63 kg/m   Physical Exam Vitals and nursing note reviewed.  Constitutional:      General: She is not in acute distress.    Appearance: She is well-developed.  HENT:     Head: Normocephalic and atraumatic.  Eyes:     Conjunctiva/sclera: Conjunctivae normal.  Cardiovascular:     Rate and Rhythm: Normal rate and regular rhythm.     Heart sounds: No murmur heard. Pulmonary:  Effort: Pulmonary effort is normal. No respiratory distress.     Breath sounds: Normal breath sounds.  Abdominal:     Palpations: Abdomen is soft.     Tenderness: There is no abdominal tenderness.     Comments: No focal abdominal tenderness  Musculoskeletal:     Cervical back: Neck supple.  Skin:    General: Skin is warm and dry.  Neurological:     Mental Status: She is alert and oriented to person, place, and time.     Comments: CN III-XII intact, speech is clear, movements are goal oriented Moves all extremities  Psychiatric:        Mood and Affect: Mood normal.        Behavior: Behavior normal.    ED Results / Procedures / Treatments   Labs (all labs ordered are listed, but only abnormal results are displayed) Labs Reviewed  URINALYSIS, ROUTINE W REFLEX MICROSCOPIC - Abnormal; Notable for the following components:      Result Value   Color, Urine YELLOW (*)    APPearance CLEAR (*)    Leukocytes,Ua SMALL (*)    All other components within normal limits   CBC WITH DIFFERENTIAL/PLATELET - Abnormal; Notable for the following components:   Hemoglobin 11.4 (*)    HCT 35.5 (*)    All other components within normal limits  COMPREHENSIVE METABOLIC PANEL - Abnormal; Notable for the following components:   Potassium 3.4 (*)    All other components within normal limits  RESP PANEL BY RT-PCR (FLU A&B, COVID) ARPGX2  URINE CULTURE  LIPASE, BLOOD  I-STAT BETA HCG BLOOD, ED ([facility], [facility], [facility] ONLY)    EKG None  Radiology No results found.  Procedures Procedures   Medications Ordered in ED Medications  sodium chloride 0.9 % bolus 1,000 mL (0 mLs Intravenous Stopped 04/26/21 0027)  ondansetron (ZOFRAN) injection 4 mg (4 mg Intravenous Given 04/25/21 2309)  ketorolac (TORADOL) 30 MG/ML injection 30 mg (30 mg Intravenous Given 04/26/21 0130)  metoCLOPramide (REGLAN) injection 10 mg (10 mg Intravenous Given 04/26/21 0132)  diphenhydrAMINE (BENADRYL) injection 25 mg (25 mg Intravenous Given 04/26/21 0131)    ED Course  I have reviewed the triage vital signs and the nursing notes.  Pertinent labs & imaging results that were available during my care of the patient were reviewed by me and considered in my medical decision making (see chart for details).    MDM Rules/Calculators/A&P                           Patient here with nausea and vomiting and headache for the past few days.  She is given headache cocktail and Zofran while in the emergency department.  She states that her headache has improved significantly.  States that she still feels slightly nauseated, but has been able to tolerate some ginger ale.  Laboratory work-up is reassuring.  She is not pregnant.  She did not have a significantly elevated leukocytosis, normal LFTs and lipase.  COVID test negative.  Urinalysis shows a few white blood cells and small leuks, but no bacteria.  Will send for culture.  Patient reassessed and appears stable for discharge.  She is understanding with discharge  plan. Final Clinical Impression(s) / ED Diagnoses Final diagnoses:  Non-intractable vomiting with nausea, unspecified vomiting type  Nonintractable headache, unspecified chronicity pattern, unspecified headache type    Rx / DC Orders ED Discharge Orders  Ordered    ondansetron (ZOFRAN ODT) 4 MG disintegrating tablet  Every 8 hours PRN        04/26/21 0216             Montine Circle, PA-C 04/26/21 0223    Molpus, Jenny Reichmann, MD 04/26/21 [phone]

## 2021-04-26 NOTE — ED Notes (Signed)
Patient provided with ginger ale for PO challenge.

## 2021-04-27 LAB — URINE CULTURE: Culture: <10000 — AB

## 2021-06-04 ENCOUNTER — Ambulatory Visit (HOSPITAL_COMMUNITY)
Admission: EM | Admit: 2021-06-04 | Discharge: 2021-06-04 | Disposition: A | Discharge status: Home / Self Care | Payer: Medicaid Other | Attending: Physician Assistant | Admitting: Physician Assistant

## 2021-06-04 ENCOUNTER — Encounter (HOSPITAL_COMMUNITY): Payer: Self-pay | Admitting: Emergency Medicine

## 2021-06-04 ENCOUNTER — Other Ambulatory Visit: Payer: Self-pay

## 2021-06-04 DIAGNOSIS — R519 Headache, unspecified: Secondary | ICD-10-CM

## 2021-06-04 DIAGNOSIS — R350 Frequency of micturition: Secondary | ICD-10-CM | POA: Diagnosis not present

## 2021-06-04 DIAGNOSIS — G44209 Tension-type headache, unspecified, not intractable: Secondary | ICD-10-CM

## 2021-06-04 DIAGNOSIS — R8290 Unspecified abnormal findings in urine: Secondary | ICD-10-CM | POA: Diagnosis not present

## 2021-06-04 DIAGNOSIS — N946 Dysmenorrhea, unspecified: Secondary | ICD-10-CM

## 2021-06-04 LAB — POCT URINALYSIS DIPSTICK, ED / UC
Bilirubin Urine: NEGATIVE
Glucose, UA: NEGATIVE mg/dL
Ketones, ur: NEGATIVE mg/dL
Nitrite: NEGATIVE
Protein, ur: 30 mg/dL — AB
Specific Gravity, Urine: 1.025 (ref 1.005–1.030)
Urobilinogen, UA: 0.2 mg/dL (ref 0.0–1.0)
pH: 6 (ref 5.0–8.0)

## 2021-06-04 LAB — POC URINE PREG, ED: Preg Test, Ur: NEGATIVE

## 2021-06-04 MED ORDER — NAPROXEN 500 MG PO TABS: 500.0000 mg | ORAL_TABLET | Freq: Two times a day (BID) | ORAL | 0 refills | Status: DC

## 2021-06-04 MED ORDER — NITROFURANTOIN MONOHYD MACRO 100 MG PO CAPS: 100.0000 mg | ORAL_CAPSULE | Freq: Two times a day (BID) | ORAL | 0 refills | Status: DC

## 2021-06-04 MED ORDER — BACLOFEN 10 MG PO TABS: 10.0000 mg | ORAL_TABLET | Freq: Two times a day (BID) | ORAL | 0 refills | Status: DC

## 2021-06-04 NOTE — Discharge Instructions (Addendum)
Start Macrobid to cover for urinary tract infection given your symptoms and abnormal urine.  We will contact you if need to change her antibiotic.  Make sure you are drinking plenty of fluid.  If you have any worsening symptoms such as abdominal pain, fever, nausea, vomiting you need to be seen immediately.  Take Naprosyn twice daily for headache.  Do not take NSAIDs including aspirin, ibuprofen/Advil, naproxen/Aleve with this medication as cause stomach bleeding.  You can use Tylenol for breakthrough pain.  Take baclofen twice daily.  This can make you sleepy do not drive or drink alcohol with taking it.  Eat small frequent meals make sure you are drinking plenty fluid.  If you have any worsening symptoms you need to be reevaluated.  If you develop sudden severe headache, nausea, vomiting, vision changes, weakness, difficulty speaking you need to go to the hospital.

## 2021-06-04 NOTE — ED Triage Notes (Signed)
Pt is present today with a HA and concerns for a UTI.Pt states that her HA started Tuesday. Pt states that she feels pressure in her bladder when she urinates. No other sx

## 2021-06-04 NOTE — ED Provider Notes (Signed)
[facility]    CSN: [id number] Arrival date & time: 06/04/21  1851      History   Chief Complaint Chief Complaint  Patient presents with   Headache    HPI Mallery Harshman is a 20 y.o. female.   Patient presents today with a 1 day history of headache.  Reports pain is rated 10 on a 0-10 pain scale, described as a band around her head, described as throbbing, no alleviating factors identified.  Denies any nausea, vomiting, dizziness, loss of consciousness, vision changes.  She does have a history of recurrent headaches and states current symptoms are similar to previous episodes of this condition.  She has tried Tylenol, Naprosyn, ibuprofen without improvement of symptoms.  Denies any head injury.  Denies any recent illness or additional symptoms including cough, congestion, chest pain, shortness of breath.  She denies seeing a neurologist in the past.  In addition, she reports pressure in her bladder and discomfort when she urinates.  Denies any dysuria but does report urinary urgency.  Denies frequency, hematuria, abdominal pain, nausea, vomiting, fever.  Denies any recent antibiotic use.  Denies history of recurrent UTI, seeing a urologist, history of nephrolithiasis, recent urogenital procedure, self-catheterization.   Past Medical History:  Diagnosis Date   Migraine 05/2019   Vitamin D deficiency 10/2020    Patient Active Problem List   Diagnosis Date Noted   Elevated BP without diagnosis of hypertension 05/26/2019   Frequent headaches 05/26/2019    History reviewed. No pertinent surgical history.  OB History     Gravida  0   Para  0   Term  0   Preterm  0   AB  0   Living  0      SAB  0   IAB  0   Ectopic  0   Multiple  0   Live Births  0            Home Medications    Prior to Admission medications   Medication Sig Start Date End Date Taking? Authorizing Provider  baclofen (LIORESAL) 10 MG tablet Take 1 tablet (10 mg total) by  mouth 2 (two) times daily. 06/04/21  Yes Aryaman Haliburton, Derry Skill, PA-C  nitrofurantoin, macrocrystal-monohydrate, (MACROBID) 100 MG capsule Take 1 capsule (100 mg total) by mouth 2 (two) times daily. 06/04/21  Yes Radwan Cowley, Derry Skill, PA-C  acetaminophen (TYLENOL) 325 MG tablet Take 650 mg by mouth every 6 (six) hours as needed for moderate pain.    [provider]  naproxen (NAPROSYN) 500 MG tablet Take 1 tablet (500 mg total) by mouth 2 (two) times daily with a meal. 06/04/21   Oluwateniola Leitch K, PA-C  ondansetron (ZOFRAN ODT) 4 MG disintegrating tablet Take 1 tablet (4 mg total) by mouth every 8 (eight) hours as needed for nausea or vomiting. 04/26/21   Montine Circle, PA-C  Vitamin D, Ergocalciferol, (DRISDOL) 1.25 MG (50000 UNIT) CAPS capsule Take 1 capsule (50,000 Units total) by mouth every 7 (seven) days. 10/25/20   Azzie Glatter, FNP    Family History Family History  Problem Relation Age of Onset   Healthy Mother    Healthy Father     Social History Social History   Tobacco Use   Smoking status: Never   Smokeless tobacco: Never  Vaping Use   Vaping Use: Never used  Substance Use Topics   Alcohol use: Never   Drug use: Never     Allergies   Patient  has no known allergies.   Review of Systems Review of Systems  Constitutional:  Positive for activity change. Negative for appetite change, fatigue and fever.  Eyes:  Negative for photophobia and visual disturbance.  Respiratory:  Negative for cough and shortness of breath.   Cardiovascular:  Negative for chest pain.  Gastrointestinal:  Negative for abdominal pain, diarrhea, nausea and vomiting.  Genitourinary:  Positive for urgency. Negative for dysuria, frequency, vaginal bleeding, vaginal discharge and vaginal pain.  Neurological:  Positive for headaches. Negative for dizziness and light-headedness.    Physical Exam Triage Vital Signs ED Triage Vitals  Enc Vitals Group     BP 06/04/21 1940 (!) 149/94     Pulse Rate  06/04/21 1939 87     Resp 06/04/21 1939 18     Temp 06/04/21 1940 98.7 F (37.1 C)     Temp src --      SpO2 06/04/21 1939 100 %     Weight --      Height --      Head Circumference --      Peak Flow --      Pain Score 06/04/21 1938 10     Pain Loc --      Pain Edu? --      Excl. in [place]? --    No data found.  Updated Vital Signs BP (!) 149/94   Pulse 87   Temp 98.7 F (37.1 C)   Resp 18   SpO2 100%   Visual Acuity Right Eye Distance:   Left Eye Distance:   Bilateral Distance:    Right Eye Near:   Left Eye Near:    Bilateral Near:     Physical Exam Vitals reviewed.  Constitutional:      General: She is awake. She is not in acute distress.    Appearance: Normal appearance. She is well-developed. She is not ill-appearing.     Comments: Very pleasant female appears stated age in no acute distress sitting comfortably in exam room  HENT:     Head: Normocephalic and atraumatic. No raccoon eyes, Battle's sign or contusion.     Right Ear: Tympanic membrane, ear canal and external ear normal. No hemotympanum.     Left Ear: Tympanic membrane, ear canal and external ear normal. No hemotympanum.     Mouth/Throat:     Tongue: Tongue does not deviate from midline.     Pharynx: Uvula midline. No oropharyngeal exudate or posterior oropharyngeal erythema.  Eyes:     Extraocular Movements: Extraocular movements intact.     Pupils: Pupils are equal, round, and reactive to light.  Cardiovascular:     Rate and Rhythm: Normal rate and regular rhythm.     Heart sounds: Normal heart sounds, S1 normal and S2 normal. No murmur heard. Pulmonary:     Effort: Pulmonary effort is normal.     Breath sounds: Normal breath sounds. No wheezing, rhonchi or rales.     Comments: Clear to auscultation bilaterally Abdominal:     General: Bowel sounds are normal.     Palpations: Abdomen is soft.     Tenderness: There is no abdominal tenderness. There is no right CVA tenderness, left CVA tenderness,  guarding or rebound.     Comments: Benign abdominal exam  Musculoskeletal:     Cervical back: Normal range of motion and neck supple. No spinous process tenderness or muscular tenderness.     Comments: Strength 5/5 bilateral upper and lower extremities  Lymphadenopathy:  Head:     Right side of head: No submental, submandibular or tonsillar adenopathy.     Left side of head: No submental, submandibular or tonsillar adenopathy.  Neurological:     General: No focal deficit present.     Motor: Motor function is intact.     Coordination: Coordination is intact.     Gait: Gait is intact.  Psychiatric:        Behavior: Behavior is cooperative.     UC Treatments / Results  Labs (all labs ordered are listed, but only abnormal results are displayed) Labs Reviewed  POCT URINALYSIS DIPSTICK, ED / UC - Abnormal; Notable for the following components:      Result Value   Hgb urine dipstick LARGE (*)    Protein, ur 30 (*)    Leukocytes,Ua MODERATE (*)    All other components within normal limits  URINE CULTURE  POC URINE PREG, ED    EKG   Radiology No results found.  Procedures Procedures (including critical care time)  Medications Ordered in UC Medications - No data to display  Initial Impression / Assessment and Plan / UC Course  I have reviewed the triage vital signs and the nursing notes.  Pertinent labs & imaging results that were available during my care of the patient were reviewed by me and considered in my medical decision making (see chart for details).     Physical exam is reassuring today with no alarm symptoms that warrant emergent evaluation for headache.  Patient was started on Naprosyn twice daily with instruction not to take additional NSAIDs with this medication due to risk of GI bleeding.  She can use Tylenol for breakthrough pain.  She was prescribed baclofen with instruction not to drive or drink alcohol with this medication as drowsiness is a common side  effect.  Encouraged her to eat small regular meals and drink plenty of fluid.  Discussed that if symptoms persist she may need to consider evaluation by neurologist but this may be arranged from her primary care provider.  Discussed alarm symptoms that warrant emergent evaluation.  Strict return precautions given to which she expressed understanding.  UA showed trace leukocyte esterase.  Discussed that we could send this off for culture and await results before starting antibiotic but patient reports current symptoms are similar to previous UTIs and requested antibiotic today.  Urine pregnancy was negative.  We will start [place].  Urine culture was sent out we will change antibiotics based on susceptibilities identified on culture.  Discussed alarm symptoms that warrant emergent evaluation.  Strict return precautions given to which she expressed understanding.  Final Clinical Impressions(s) / UC Diagnoses   Final diagnoses:  Tension headache  Bad headache  Urinary frequency  Abnormal urinalysis     Discharge Instructions      Start Macrobid to cover for urinary tract infection given your symptoms and abnormal urine.  We will contact you if need to change her antibiotic.  Make sure you are drinking plenty of fluid.  If you have any worsening symptoms such as abdominal pain, fever, nausea, vomiting you need to be seen immediately.  Take Naprosyn twice daily for headache.  Do not take NSAIDs including aspirin, ibuprofen/Advil, naproxen/Aleve with this medication as cause stomach bleeding.  You can use Tylenol for breakthrough pain.  Take baclofen twice daily.  This can make you sleepy do not drive or drink alcohol with taking it.  Eat small frequent meals make sure you are drinking plenty fluid.  If you have any worsening symptoms you need to be reevaluated.  If you develop sudden severe headache, nausea, vomiting, vision changes, weakness, difficulty speaking you need to go to the  hospital.     ED Prescriptions     Medication Sig Dispense Auth. Provider   naproxen (NAPROSYN) 500 MG tablet Take 1 tablet (500 mg total) by mouth 2 (two) times daily with a meal. 10 tablet Jong Rickman K, PA-C   baclofen (LIORESAL) 10 MG tablet Take 1 tablet (10 mg total) by mouth 2 (two) times daily. 10 each Shuayb Schepers K, PA-C   nitrofurantoin, macrocrystal-monohydrate, (MACROBID) 100 MG capsule Take 1 capsule (100 mg total) by mouth 2 (two) times daily. 10 capsule Virjean Boman, Derry Skill, PA-C      PDMP not reviewed this encounter.   Terrilee Croak, PA-C 06/04/21 2031

## 2021-06-06 LAB — URINE CULTURE: Culture: >=100000 — AB

## 2021-06-29 ENCOUNTER — Emergency Department (HOSPITAL_COMMUNITY): Payer: Medicaid Other

## 2021-06-29 ENCOUNTER — Other Ambulatory Visit: Payer: Self-pay

## 2021-06-29 ENCOUNTER — Encounter (HOSPITAL_COMMUNITY): Payer: Self-pay

## 2021-06-29 ENCOUNTER — Emergency Department (HOSPITAL_COMMUNITY)
Admission: EM | Admit: 2021-06-29 | Discharge: 2021-06-29 | Disposition: A | Discharge status: Home / Self Care | Payer: Medicaid Other | Attending: Emergency Medicine | Admitting: Emergency Medicine

## 2021-06-29 DIAGNOSIS — R519 Headache, unspecified: Secondary | ICD-10-CM | POA: Insufficient documentation

## 2021-06-29 DIAGNOSIS — R42 Dizziness and giddiness: Secondary | ICD-10-CM | POA: Insufficient documentation

## 2021-06-29 LAB — COMPREHENSIVE METABOLIC PANEL
ALT: 21 U/L (ref 0–44)
AST: 24 U/L (ref 15–41)
Albumin: 4 g/dL (ref 3.5–5.0)
Alkaline Phosphatase: 88 U/L (ref 38–126)
Anion gap: 6 (ref 5–15)
BUN: 12 mg/dL (ref 6–20)
CO2: 24 mmol/L (ref 22–32)
Calcium: 9.1 mg/dL (ref 8.9–10.3)
Chloride: 105 mmol/L (ref 98–111)
Creatinine, Ser: 0.78 mg/dL (ref 0.44–1.00)
GFR, Estimated: >60 mL/min (ref >60)
Glucose, Bld: 103 mg/dL — ABNORMAL HIGH (ref 70–99)
Potassium: 3.6 mmol/L (ref 3.5–5.1)
Sodium: 135 mmol/L (ref 135–145)
Total Bilirubin: 0.6 mg/dL (ref 0.3–1.2)
Total Protein: 7.8 g/dL (ref 6.5–8.1)

## 2021-06-29 LAB — CBC WITH DIFFERENTIAL/PLATELET
Abs Immature Granulocytes: 0.01 10*3/uL (ref 0.00–0.07)
Basophils Absolute: 0.1 10*3/uL (ref 0.0–0.1)
Basophils Relative: 1 %
Eosinophils Absolute: 0.1 10*3/uL (ref 0.0–0.5)
Eosinophils Relative: 2 %
HCT: 38.5 % (ref 36.0–46.0)
Hemoglobin: 12.2 g/dL (ref 12.0–15.0)
Immature Granulocytes: 0 %
Lymphocytes Relative: 50 %
Lymphs Abs: 2.7 10*3/uL (ref 0.7–4.0)
MCH: 26.1 pg (ref 26.0–34.0)
MCHC: 31.7 g/dL (ref 30.0–36.0)
MCV: 82.3 fL (ref 80.0–100.0)
Monocytes Absolute: 0.5 10*3/uL (ref 0.1–1.0)
Monocytes Relative: 9 %
Neutro Abs: 2 10*3/uL (ref 1.7–7.7)
Neutrophils Relative %: 38 %
Platelets: 261 10*3/uL (ref 150–400)
RBC: 4.68 MIL/uL (ref 3.87–5.11)
RDW: 14.8 % (ref 11.5–15.5)
WBC: 5.4 10*3/uL (ref 4.0–10.5)
nRBC: 0 % (ref 0.0–0.2)

## 2021-06-29 LAB — I-STAT BETA HCG BLOOD, ED (MC, WL, AP ONLY): I-stat hCG, quantitative: <5 m[IU]/mL (ref <5)

## 2021-06-29 MED ORDER — METOCLOPRAMIDE HCL 5 MG/ML IJ SOLN
10.0000 mg | Freq: Once | INTRAMUSCULAR | Status: AC
  Administered 2021-06-29: 10 mg via INTRAVENOUS
  Filled 2021-06-29: qty 2

## 2021-06-29 MED ORDER — DIPHENHYDRAMINE HCL 50 MG/ML IJ SOLN
25.0000 mg | Freq: Once | INTRAMUSCULAR | Status: AC
  Administered 2021-06-29: 25 mg via INTRAVENOUS
  Filled 2021-06-29: qty 1

## 2021-06-29 MED ORDER — KETOROLAC TROMETHAMINE 30 MG/ML IJ SOLN
30.0000 mg | Freq: Once | INTRAMUSCULAR | Status: AC
  Administered 2021-06-29: 30 mg via INTRAVENOUS
  Filled 2021-06-29: qty 1

## 2021-06-29 MED ORDER — SODIUM CHLORIDE 0.9 % IV BOLUS
500.0000 mL | Freq: Once | INTRAVENOUS | Status: AC
  Administered 2021-06-29: 500 mL via INTRAVENOUS

## 2021-06-29 MED ORDER — DEXAMETHASONE SODIUM PHOSPHATE 10 MG/ML IJ SOLN
10.0000 mg | Freq: Once | INTRAMUSCULAR | Status: AC
  Administered 2021-06-29: 10 mg via INTRAVENOUS
  Filled 2021-06-29: qty 1

## 2021-06-29 NOTE — ED Triage Notes (Signed)
Pt reports with headache and dizziness x 2 days. Pt reports history of migraines but states that it has been worse over the past 2 days.

## 2021-06-29 NOTE — ED Provider Notes (Signed)
[facility] DEPT Provider Note   CSN: [id number] Arrival date & time: 06/29/21  0011     History Chief Complaint  Patient presents with   Headache   Dizziness    Diana Robinson is a 20 y.o. female presents to the emergency department with complaints of ongoing headaches for the last 2 years.  Reports that current headache started approximately 7 days ago and has been waxing and waning.  She reports it is worst in the mornings and is occasionally associated with vomiting.  Additionally she has photophobia and phonophobia.  Reports taking naproxen and Flexeril without relief.  She has never seen a neurologist for her headaches or ever had any imaging.  She is worried about the possibility of a brain tumor.  Denies numbness, tingling, weakness, word finding difficulties, difficulty walking or difficulty with balance.  She does report that this afternoon she had a short spell of dizziness that made her feel off balance but this resolved spontaneously and has not returned.  No syncope or near syncope.  No history of blood clots, DVT/PE or OCP usage.   The history is provided by the patient and medical records. No language interpreter was used.      Past Medical History:  Diagnosis Date   Migraine 05/2019   Vitamin D deficiency 10/2020    Patient Active Problem List   Diagnosis Date Noted   Elevated BP without diagnosis of hypertension 05/26/2019   Frequent headaches 05/26/2019    History reviewed. No pertinent surgical history.   OB History     Gravida  0   Para  0   Term  0   Preterm  0   AB  0   Living  0      SAB  0   IAB  0   Ectopic  0   Multiple  0   Live Births  0           Family History  Problem Relation Age of Onset   Healthy Mother    Healthy Father     Social History   Tobacco Use   Smoking status: Never   Smokeless tobacco: Never  Vaping Use   Vaping Use: Never used  Substance Use Topics   Alcohol  use: Never   Drug use: Never    Home Medications Prior to Admission medications   Medication Sig Start Date End Date Taking? Authorizing Provider  baclofen (LIORESAL) 10 MG tablet Take 1 tablet (10 mg total) by mouth 2 (two) times daily. 06/04/21  Yes Raspet, Erin K, PA-C  naproxen (NAPROSYN) 500 MG tablet Take 1 tablet (500 mg total) by mouth 2 (two) times daily with a meal. 06/04/21  Yes Raspet, Erin K, PA-C  acetaminophen (TYLENOL) 325 MG tablet Take 650 mg by mouth every 6 (six) hours as needed for moderate pain. Patient not taking: Reported on 06/29/2021    [provider]  nitrofurantoin, macrocrystal-monohydrate, (MACROBID) 100 MG capsule Take 1 capsule (100 mg total) by mouth 2 (two) times daily. Patient not taking: Reported on 06/29/2021 06/04/21   Raspet, Erin K, PA-C  ondansetron (ZOFRAN ODT) 4 MG disintegrating tablet Take 1 tablet (4 mg total) by mouth every 8 (eight) hours as needed for nausea or vomiting. Patient not taking: Reported on 06/29/2021 04/26/21   Montine Circle, PA-C  Vitamin D, Ergocalciferol, (DRISDOL) 1.25 MG (50000 UNIT) CAPS capsule Take 1 capsule (50,000 Units total) by mouth every 7 (seven) days. Patient not taking:  Reported on 06/29/2021 10/25/20   Azzie Glatter, FNP    Allergies    Patient has no known allergies.  Review of Systems   Review of Systems  Constitutional:  Negative for appetite change, diaphoresis, fatigue, fever and unexpected weight change.  HENT:  Negative for mouth sores.   Eyes:  Negative for visual disturbance.  Respiratory:  Negative for cough, chest tightness, shortness of breath and wheezing.   Cardiovascular:  Negative for chest pain.  Gastrointestinal:  Negative for abdominal pain, constipation, diarrhea, nausea and vomiting.  Endocrine: Negative for polydipsia, polyphagia and polyuria.  Genitourinary:  Negative for dysuria, frequency, hematuria and urgency.  Musculoskeletal:  Negative for back pain and neck  stiffness.  Skin:  Negative for rash.  Allergic/Immunologic: Negative for immunocompromised state.  Neurological:  Positive for dizziness and headaches. Negative for syncope and light-headedness.  Hematological:  Does not bruise/bleed easily.  Psychiatric/Behavioral:  Negative for sleep disturbance. The patient is not nervous/anxious.    Physical Exam Updated Vital Signs BP (!) 135/91   Pulse 70   Temp 98.6 F (37 C) (Oral)   Resp 17   Ht 5\' 5"  (1.651 m)   Wt 65.8 kg   SpO2 100%   BMI 24.13 kg/m   Physical Exam Vitals and nursing note reviewed.  Constitutional:      General: She is not in acute distress.    Appearance: She is well-developed. She is not diaphoretic.  HENT:     Head: Normocephalic and atraumatic.     Nose: Nose normal.     Mouth/Throat:     Mouth: Mucous membranes are moist.  Eyes:     General: No scleral icterus.    Conjunctiva/sclera: Conjunctivae normal.     Pupils: Pupils are equal, round, and reactive to light.     Comments: No horizontal, vertical or rotational nystagmus  Neck:     Comments: Full active and passive ROM without pain No midline or paraspinal tenderness No nuchal rigidity or meningeal signs Cardiovascular:     Rate and Rhythm: Normal rate and regular rhythm.     Heart sounds: Normal heart sounds.  Pulmonary:     Effort: Pulmonary effort is normal. No respiratory distress.     Breath sounds: No wheezing or rales.  Abdominal:     General: There is no distension.     Palpations: Abdomen is soft.     Tenderness: There is no abdominal tenderness. There is no guarding or rebound.  Musculoskeletal:        General: Normal range of motion.     Cervical back: Normal range of motion and neck supple.  Lymphadenopathy:     Cervical: No cervical adenopathy.  Skin:    General: Skin is warm and dry.     Findings: No rash.  Neurological:     Mental Status: She is alert and oriented to person, place, and time.     Cranial Nerves: No cranial  nerve deficit.     Motor: No abnormal muscle tone.     Coordination: Coordination normal.     Comments: Mental Status:  Alert, oriented, thought content appropriate. Speech fluent without evidence of aphasia. Able to follow 2 step commands without difficulty.  Cranial Nerves:  II:  Peripheral visual fields grossly normal, pupils equal, round, reactive to light III,IV, VI: ptosis not present, extra-ocular motions intact bilaterally  V,VII: smile symmetric, facial light touch sensation equal VIII: hearing grossly normal bilaterally  IX,X: midline uvula rise  XI: bilateral  shoulder shrug equal and strong XII: midline tongue extension  Motor:  5/5 in upper and lower extremities bilaterally including strong and equal grip strength and dorsiflexion/plantar flexion Sensory: Pinprick and light touch normal in all extremities.  Cerebellar: normal finger-to-nose with bilateral upper extremities Gait: normal gait and balance CV: distal pulses palpable throughout   Psychiatric:        Behavior: Behavior normal.        Thought Content: Thought content normal.        Judgment: Judgment normal.    ED Results / Procedures / Treatments   Labs (all labs ordered are listed, but only abnormal results are displayed) Labs Reviewed  COMPREHENSIVE METABOLIC PANEL - Abnormal; Notable for the following components:      Result Value   Glucose, Bld 103 (*)    All other components within normal limits  CBC WITH DIFFERENTIAL/PLATELET  I-STAT BETA HCG BLOOD, ED ([facility], [facility], [facility] ONLY)    Radiology CT HEAD WO CONTRAST (5MM)  Result Date: 06/29/2021 CLINICAL DATA:  Cluster headache EXAM: CT HEAD WITHOUT CONTRAST TECHNIQUE: Contiguous axial images were obtained from the base of the skull through the vertex without intravenous contrast. COMPARISON:  None. FINDINGS: Brain: There is no mass, hemorrhage or extra-axial collection. The size and configuration of the ventricles and extra-axial CSF spaces are normal. The  brain parenchyma is normal, without acute or chronic infarction. Vascular: No abnormal hyperdensity of the major intracranial arteries or dural venous sinuses. No intracranial atherosclerosis. Skull: The visualized skull base, calvarium and extracranial soft tissues are normal. Sinuses/Orbits: No fluid levels or advanced mucosal thickening of the visualized paranasal sinuses. No mastoid or middle ear effusion. The orbits are normal. IMPRESSION: Normal head CT. Electronically Signed   By: Ulyses Jarred M.D.   On: 06/29/2021 02:33    Procedures Procedures   Medications Ordered in ED Medications  sodium chloride 0.9 % bolus 500 mL (0 mLs Intravenous Stopped 06/29/21 0236)  metoCLOPramide (REGLAN) injection 10 mg (10 mg Intravenous Given 06/29/21 0125)  diphenhydrAMINE (BENADRYL) injection 25 mg (25 mg Intravenous Given 06/29/21 0125)  dexamethasone (DECADRON) injection 10 mg (10 mg Intravenous Given 06/29/21 0125)  ketorolac (TORADOL) 30 MG/ML injection 30 mg (30 mg Intravenous Given 06/29/21 0309)    ED Course  I have reviewed the triage vital signs and the nursing notes.  Pertinent labs & imaging results that were available during my care of the patient were reviewed by me and considered in my medical decision making (see chart for details).    MDM Rules/Calculators/A&P                           Patient presents with ongoing headache.  No previous imaging.  CT scan within normal limits.  Neurologically intact.  No risk factors for dural venous thrombosis.  3:04 AM Patient reports feeling significantly better.  She has been able to eat and drink without difficulty.  Ambulates without assistance.  Headache is almost fully resolved.  Will discharge home with close PCP and neurology follow-up.  Discussed reasons to return to the emergency department.  Patient states understanding and is in agreement with the plan.   Final Clinical Impression(s) / ED Diagnoses Final diagnoses:  Bad headache     Rx / DC Orders ED Discharge Orders     None        Rhesa Forsberg, Gwenlyn Perking 06/29/21 0334    Quintella Reichert, MD 06/29/21 [phone]

## 2021-06-29 NOTE — Discharge Instructions (Signed)
1. Medications: usual home medications 2. Treatment: rest, drink plenty of fluids,  3. Follow Up: Please followup with your primary doctor and Neurology in 3-5 days for discussion of your diagnoses and further evaluation after today's visit; if you do not have a primary care doctor use the resource guide provided to find one; Please return to the ER for new or worsening symptoms

## 2021-07-01 ENCOUNTER — Encounter: Payer: Self-pay | Admitting: Psychiatry

## 2021-07-01 ENCOUNTER — Ambulatory Visit: Payer: Medicaid Other | Admitting: Psychiatry
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
## 2021-07-10 ENCOUNTER — Ambulatory Visit (INDEPENDENT_AMBULATORY_CARE_PROVIDER_SITE_OTHER): Payer: Medicaid Other

## 2021-07-10 ENCOUNTER — Other Ambulatory Visit: Payer: Self-pay

## 2021-07-10 ENCOUNTER — Encounter (HOSPITAL_COMMUNITY): Payer: Self-pay

## 2021-07-10 ENCOUNTER — Ambulatory Visit (HOSPITAL_COMMUNITY)
Admission: EM | Admit: 2021-07-10 | Discharge: 2021-07-10 | Disposition: A | Discharge status: Home / Self Care | Payer: Medicaid Other | Attending: Physician Assistant | Admitting: Physician Assistant

## 2021-07-10 DIAGNOSIS — N898 Other specified noninflammatory disorders of vagina: Secondary | ICD-10-CM | POA: Insufficient documentation

## 2021-07-10 DIAGNOSIS — R0789 Other chest pain: Secondary | ICD-10-CM | POA: Diagnosis not present

## 2021-07-10 DIAGNOSIS — R079 Chest pain, unspecified: Secondary | ICD-10-CM

## 2021-07-10 LAB — POCT URINALYSIS DIPSTICK, ED / UC
Bilirubin Urine: NEGATIVE
Glucose, UA: NEGATIVE mg/dL
Hgb urine dipstick: NEGATIVE
Nitrite: NEGATIVE
Protein, ur: NEGATIVE mg/dL
Specific Gravity, Urine: 1.015 (ref 1.005–1.030)
Urobilinogen, UA: 0.2 mg/dL (ref 0.0–1.0)
pH: 6 (ref 5.0–8.0)

## 2021-07-10 LAB — POC URINE PREG, ED: Preg Test, Ur: NEGATIVE

## 2021-07-10 MED ORDER — FLUCONAZOLE 150 MG PO TABS: 150.0000 mg | ORAL_TABLET | ORAL | 0 refills | Status: AC

## 2021-07-10 NOTE — ED Provider Notes (Signed)
[facility]    CSN: [id number] Arrival date & time: 07/10/21  1215      History   Chief Complaint Chief Complaint  Patient presents with   Vaginal Discharge   Dysuria         HPI Diana Robinson is a 20 y.o. female.   Patient presents today with 3-day history of vaginal discomfort.  She reports associated vaginal discharge which she describes as thick and yellow.  She describes discomfort as a burning sensation that is present all the time but worse if urine touches her skin.  She is sexually active but in a monogamous relationship and denies any new partners.  Denies any change in personal hygiene products including underwear, medications, recent antibiotic use, detergent, condom brand, lubricant.  Does report she changed her soap recently but is unsure if this is contributing to symptoms.  Denies any urinary symptoms including dysuria, urinary frequency, urinary urgency.  She does report some pelvic cramping but denies any fever or abdominal pain.  She is unsure if she is pregnant.  In addition, patient reports a several day history of intermittent left-sided chest pain.  During episodes she reports pain is rated 10, described as sharp, worse with laying flat, no alleviating factors identified.  She has not noticed any association with eating.  Denies any associated abdominal pain, nausea, vomiting, esophageal burning.  She is currently asymptomatic but reports symptoms occur primarily when she is going to bed.  She is able to manage them with deep breathing exercises until resolution.  She has not tried any over-the-counter medication for symptom management.  Denies any history of cardiovascular disease.  Denies history of asthma.  Denies any recent medication changes.  Denies any recent illness or additional symptoms including cough, shortness of breath, nasal congestion, sore throat.   Past Medical History:  Diagnosis Date   Migraine 05/2019   Vitamin D deficiency 10/2020     Patient Active Problem List   Diagnosis Date Noted   Elevated BP without diagnosis of hypertension 05/26/2019   Frequent headaches 05/26/2019    History reviewed. No pertinent surgical history.  OB History     Gravida  0   Para  0   Term  0   Preterm  0   AB  0   Living  0      SAB  0   IAB  0   Ectopic  0   Multiple  0   Live Births  0            Home Medications    Prior to Admission medications   Medication Sig Start Date End Date Taking? Authorizing Provider  fluconazole (DIFLUCAN) 150 MG tablet Take 1 tablet (150 mg total) by mouth every 3 (three) days for 2 doses. 07/10/21 07/14/21 Yes Kiel Cockerell, Derry Skill, PA-C  acetaminophen (TYLENOL) 325 MG tablet Take 650 mg by mouth every 6 (six) hours as needed for moderate pain. Patient not taking: Reported on 06/29/2021    [provider]  baclofen (LIORESAL) 10 MG tablet Take 1 tablet (10 mg total) by mouth 2 (two) times daily. 06/04/21   Carmine Youngberg, Derry Skill, PA-C  naproxen (NAPROSYN) 500 MG tablet Take 1 tablet (500 mg total) by mouth 2 (two) times daily with a meal. 06/04/21   Deegan Valentino, Derry Skill, PA-C    Family History Family History  Problem Relation Age of Onset   Healthy Mother    Healthy Father     Social History  Social History   Tobacco Use   Smoking status: Never   Smokeless tobacco: Never  Vaping Use   Vaping Use: Never used  Substance Use Topics   Alcohol use: Never   Drug use: Never     Allergies   Patient has no known allergies.   Review of Systems Review of Systems  Constitutional:  Positive for activity change. Negative for appetite change, fatigue and fever.  HENT:  Negative for congestion, sinus pressure, sneezing and sore throat.   Respiratory:  Negative for cough and shortness of breath.   Cardiovascular:  Positive for chest pain. Negative for palpitations and leg swelling.  Gastrointestinal:  Negative for abdominal pain, diarrhea, nausea and vomiting.  Genitourinary:   Positive for pelvic pain, vaginal discharge and vaginal pain. Negative for dysuria, frequency, urgency and vaginal bleeding.  Neurological:  Negative for dizziness, light-headedness and headaches.    Physical Exam Triage Vital Signs ED Triage Vitals  Enc Vitals Group     BP 07/10/21 1420 (!) 148/95     Pulse Rate 07/10/21 1420 (!) 103     Resp 07/10/21 1420 16     Temp 07/10/21 1420 99.6 F (37.6 C)     Temp Source 07/10/21 1420 Oral     SpO2 07/10/21 1420 99 %     Weight --      Height --      Head Circumference --      Peak Flow --      Pain Score 07/10/21 1419 0     Pain Loc --      Pain Edu? --      Excl. in [place]? --    No data found.  Updated Vital Signs BP (!) 148/95 (BP Location: Left Arm)   Pulse (!) 103   Temp 99.6 F (37.6 C) (Oral)   Resp 16   LMP  (Within Months) Comment: 1 month  SpO2 99%   Visual Acuity Right Eye Distance:   Left Eye Distance:   Bilateral Distance:    Right Eye Near:   Left Eye Near:    Bilateral Near:     Physical Exam Vitals reviewed.  Constitutional:      General: She is awake. She is not in acute distress.    Appearance: Normal appearance. She is well-developed. She is not ill-appearing.     Comments: Very pleasant female appears stated age in no acute distress sitting comfortably in exam room  HENT:     Head: Normocephalic and atraumatic.  Cardiovascular:     Rate and Rhythm: Regular rhythm. Tachycardia present.     Heart sounds: Normal heart sounds, S1 normal and S2 normal. No murmur heard. Pulmonary:     Effort: Pulmonary effort is normal.     Breath sounds: Normal breath sounds. No wheezing, rhonchi or rales.     Comments: Clear to auscultation bilaterally Chest:     Chest wall: No deformity, swelling or tenderness.  Abdominal:     General: Bowel sounds are normal.     Palpations: Abdomen is soft.     Tenderness: There is no abdominal tenderness. There is no right CVA tenderness, left CVA tenderness, guarding or  rebound.     Comments: Benign abdominal exam  Genitourinary:    Labia:        Right: No rash or tenderness.        Left: No rash or tenderness.      Vagina: Vaginal discharge present.     Comments:  Significant yellow discharge in posterior vaginal vault.  No pain bimanual exam. Musculoskeletal:     Right lower leg: No edema.     Left lower leg: No edema.  Psychiatric:        Behavior: Behavior is cooperative.     UC Treatments / Results  Labs (all labs ordered are listed, but only abnormal results are displayed) Labs Reviewed  POCT URINALYSIS DIPSTICK, ED / UC - Abnormal; Notable for the following components:      Result Value   Ketones, ur TRACE (*)    Leukocytes,Ua MODERATE (*)    All other components within normal limits  URINE CULTURE  POC URINE PREG, ED  CERVICOVAGINAL ANCILLARY ONLY    EKG   Radiology DG Chest 2 View  Result Date: 07/10/2021 CLINICAL DATA:  Chest pain for 4 days EXAM: CHEST - 2 VIEW COMPARISON:  None FINDINGS: Normal heart size, mediastinal contours, and pulmonary vascularity. Lungs clear. No pleural effusion or pneumothorax. Bones unremarkable. IMPRESSION: Normal exam. Electronically Signed   By: Lavonia Dana M.D.   On: 07/10/2021 15:29    Procedures Procedures (including critical care time)  Medications Ordered in UC Medications - No data to display  Initial Impression / Assessment and Plan / UC Course  I have reviewed the triage vital signs and the nursing notes.  Pertinent labs & imaging results that were available during my care of the patient were reviewed by me and considered in my medical decision making (see chart for details).     Chest x-ray was normal.  Patient reports that pain had resolved and was not reproducible on exam.  EKG was obtained that showed normal sinus rhythm with ventricular rate of 95 bpm and no previous to compare.  Low suspicion for cardiac etiology including pericarditis given clinical presentation.  Recommended  she use over-the-counter analgesics for pain and use lifestyle and dietary modification to manage acid reflux in case this is contributing to symptoms.  Discussed that she should follow-up with her PCP first thing next week for further evaluation and management as they can also consider referral to cardiology.  Discussed that if she has any recurrent episodes in the meantime including severe chest pain with any associated symptoms she needs to go to the emergency room.  Strict return precautions given to which she expressed understanding.  Unclear etiology of vaginal discharge.  Given associated irritation we will treat for yeast infection with Diflucan but STI swab was collected and sent off we discussed that we will contact her treatment additional treatment based on these lab results.  Recommended she use hypoallergenic soaps and detergents and wear loosefitting cotton underwear.  Discussed that if symptoms persist or worsen may need to consider seeing OB/GYN.  Discussed alarm symptoms that warrant emergent evaluation.  Strict return precautions given to which she expressed understanding.  Final Clinical Impressions(s) / UC Diagnoses   Final diagnoses:  Vaginal irritation  Vaginal discharge  Atypical chest pain     Discharge Instructions      We will contact you if any drainage any additional treatment based on your swab result.  Treat yeast infection with Diflucan.  Wear loosefitting cotton underwear and use hypoallergenic soaps and detergents.  If anything worsens please return for reevaluation.  Your EKG and chest x-ray were normal.  If you continue to have recurrent episodes please follow-up with your primary care provider if they are able to do additional testing and consider referral to cardiology.  If you develop any  severe chest pain with associated shortness of breath, nausea, vomiting, lightheadedness, dizziness, passing out you must go to the emergency room as we discussed.  Please  call schedule appointment with her PCP first thing next week.     ED Prescriptions     Medication Sig Dispense Auth. Provider   fluconazole (DIFLUCAN) 150 MG tablet Take 1 tablet (150 mg total) by mouth every 3 (three) days for 2 doses. 2 tablet Ariane Ditullio, Derry Skill, PA-C      PDMP not reviewed this encounter.   Terrilee Croak, PA-C 07/10/21 1547

## 2021-07-10 NOTE — ED Triage Notes (Signed)
Pt reports yellow vaginal discharge and burning when urinating x 3 days.

## 2021-07-10 NOTE — Discharge Instructions (Signed)
We will contact you if any drainage any additional treatment based on your swab result.  Treat yeast infection with Diflucan.  Wear loosefitting cotton underwear and use hypoallergenic soaps and detergents.  If anything worsens please return for reevaluation.  Your EKG and chest x-ray were normal.  If you continue to have recurrent episodes please follow-up with your primary care provider if they are able to do additional testing and consider referral to cardiology.  If you develop any severe chest pain with associated shortness of breath, nausea, vomiting, lightheadedness, dizziness, passing out you must go to the emergency room as we discussed.  Please call schedule appointment with her PCP first thing next week.

## 2021-07-11 LAB — URINE CULTURE: Culture: NO GROWTH

## 2021-07-13 ENCOUNTER — Telehealth (HOSPITAL_COMMUNITY): Payer: Self-pay | Admitting: Emergency Medicine

## 2021-07-13 LAB — CERVICOVAGINAL ANCILLARY ONLY
Bacterial Vaginitis (gardnerella): POSITIVE — AB
Candida Glabrata: NEGATIVE
Candida Vaginitis: NEGATIVE
Chlamydia: NEGATIVE
Comment: NEGATIVE
Comment: NEGATIVE
Comment: NEGATIVE
Comment: NEGATIVE
Comment: NEGATIVE
Comment: NORMAL
Neisseria Gonorrhea: NEGATIVE
Trichomonas: NEGATIVE

## 2021-07-13 MED ORDER — METRONIDAZOLE 500 MG PO TABS: 500.0000 mg | ORAL_TABLET | Freq: Two times a day (BID) | ORAL | 0 refills | Status: DC

## 2021-07-17 ENCOUNTER — Telehealth: Payer: Medicaid Other | Admitting: Physician Assistant

## 2021-07-17 DIAGNOSIS — N946 Dysmenorrhea, unspecified: Secondary | ICD-10-CM | POA: Diagnosis not present

## 2021-07-17 MED ORDER — IBUPROFEN 800 MG PO TABS: 800.0000 mg | ORAL_TABLET | Freq: Three times a day (TID) | ORAL | 0 refills | Status: DC | PRN

## 2021-07-17 NOTE — Patient Instructions (Addendum)
Adonis Housekeeper, thank you for joining Mar Daring, PA-C for today's virtual visit.  While this provider is not your primary care provider (PCP), if your PCP is located in our provider database this encounter information will be shared with them immediately following your visit.  Consent: (Patient) Diana Robinson provided verbal consent for this virtual visit at the beginning of the encounter.  Current Medications:  Current Outpatient Medications:    ibuprofen (ADVIL) 800 MG tablet, Take 1 tablet (800 mg total) by mouth every 8 (eight) hours as needed., Disp: 30 tablet, Rfl: 0   acetaminophen (TYLENOL) 325 MG tablet, Take 650 mg by mouth every 6 (six) hours as needed for moderate pain. (Patient not taking: Reported on 06/29/2021), Disp: , Rfl:    baclofen (LIORESAL) 10 MG tablet, Take 1 tablet (10 mg total) by mouth 2 (two) times daily., Disp: 10 each, Rfl: 0   metroNIDAZOLE (FLAGYL) 500 MG tablet, Take 1 tablet (500 mg total) by mouth 2 (two) times daily., Disp: 14 tablet, Rfl: 0   Medications ordered in this encounter:  Meds ordered this encounter  Medications   ibuprofen (ADVIL) 800 MG tablet    Sig: Take 1 tablet (800 mg total) by mouth every 8 (eight) hours as needed.    Dispense:  30 tablet    Refill:  0    Order Specific Question:   Supervising Provider    Answer:   Sabra Heck, BRIAN [[id number]]     *If you need refills on other medications prior to your next appointment, please contact your pharmacy*  Follow-Up: Call back or seek an in-person evaluation if the symptoms worsen or if the condition fails to improve as anticipated.  Other Instructions Magnesium Sulfate 250 mg  Dysmenorrhea Dysmenorrhea refers to cramps caused by the muscles of the uterus tightening (contracting) during a menstrual period. Dysmenorrhea may be mild, or it may be severe enough to interfere with everyday activities for a few days each month. Primary dysmenorrhea is menstrual cramps that last a couple of  days when a female starts having menstrual periods or soon after. As a female gets older or has a baby, the cramps will usually lessen or disappear. Secondary dysmenorrhea begins later in life and is caused by a disorder in the reproductive system. It lasts longer, and it may cause more pain than primary dysmenorrhea. The pain may start before the period and last a few days after the period. What are the causes? Dysmenorrhea is usually caused by an underlying problem, such as: Endometriosis. The tissue that lines the uterus (endometrium) growing outside of the uterus in other areas of the body. Adenomyosis. Endometrial tissue growing into the muscular walls of the uterus. Pelvic congestive syndrome. Blood vessels in the pelvis that fill with blood just before the menstrual period. Overgrowth of cells (polyps) in the endometrium or the lower part of the uterus (cervix). Uterine prolapse. The uterus dropping down into the vagina due to stretched or weak muscles. Bladder problems, such as infection or inflammation. Intestinal problems, such as a tumor or irritable bowel syndrome. Cancer of the reproductive organs or bladder. Other causes of this condition may result from: A severely tipped uterus. A cervix that is closed or has a small opening. Noncancerous (benign) tumors in the uterus (fibroids). Pelvic inflammatory disease (PID). Pelvic scarring (adhesions) from a previous surgery. An ovarian cyst. An IUD (intrauterine device). What increases the risk? You are more likely to develop this condition if: You are younger than 21 years old.  You started puberty early. You have irregular or heavy bleeding. You have never given birth. You have a family history of dysmenorrhea. You smoke or use nicotine products. You have high body weight or a low body weight. What are the signs or symptoms? Symptoms of this condition include: Cramping, throbbing pain in lower abdomen or lower back, or a  feeling of fullness in the lower abdomen. Periods lasting for longer than 7 days. Headaches. Bloating. Fatigue. Nausea or vomiting. Diarrhea or loose stools. Sweating or dizziness. How is this diagnosed? This condition may be diagnosed based on: Your symptoms. Your medical history. A physical exam. Blood tests. A Pap test. This is a test in which cells from the cervix are tested for signs of cancer or infection. A pregnancy test. You may also have other tests, including: Imaging tests, such as: Ultrasound. A procedure to remove and examine a sample of endometrial tissue (dilation and curettage, D&C). A procedure to visually examine the inside of: The uterus (hysteroscopy). The abdomen or pelvis (laparoscopy). The bladder (cystoscopy). X-rays. CT scan. MRI. How is this treated? Treatment depends on the cause of the dysmenorrhea. Treatment may include medicines, such as: Pain medicines. Hormone replacement therapy. Injections of progesterone to stop the menstrual period. Birth control pills that contain the hormone progesterone. An IUD that contains the hormone progesterone. NSAIDs, such as ibuprofen. These may help to stop the production of hormones that cause cramps. Antidepressant medicines. Other treatment may include: Surgery to remove adhesions, endometriosis, ovarian cysts, fibroids, or the entire uterus (hysterectomy). Endometrial ablation. This is a procedure to destroy the endometrium. Presacral neurectomy. This is a procedure to cut the nerves in the bottom of the spine (sacrum) that go to the reproductive organs. Sacral nerve stimulation. This is a procedure to apply an electric current to nerves in the sacrum. Exercise and physical therapy. Meditation, yoga, and acupuncture. Work with your health care provider to determine what treatment or combination of treatments is best for you. Follow these instructions at home: Relieving pain and cramping  If directed,  apply heat to your lower back or abdomen when you experience pain or cramps. Use the heat source that your health care provider recommends, such as a moist heat pack or a heating pad. Place a towel between your skin and the heat source. Leave the heat on for 20-30 minutes. Remove the heat if your skin turns bright red. This is especially important if you are unable to feel pain, heat, or cold. You may have a greater risk of getting burned. Do not sleep with a heating pad on. Exercise. Activities such as walking, swimming, or biking can help to relieve cramps. Massage your lower back or abdomen to help relieve pain. General instructions Take over-the-counter and prescription medicines only as told by your health care provider. Ask your health care provider if the medicine prescribed to you requires you to avoid driving or using machinery. Avoid alcohol and caffeine during and right before your period. These can make cramps worse. Do not use any products that contain nicotine or tobacco. These products include cigarettes, chewing tobacco, and vaping devices, such as e-cigarettes. If you need help quitting, ask your health care provider. Keep all follow-up visits. This is important. Contact a health care provider if: You have pain that gets worse or does not get better with medicine. You have pain with sex. You develop nausea or vomiting with your period that is not controlled with medicine. Get help right away if: You faint.  Summary Dysmenorrhea refers to cramps caused by the muscles of the uterus tightening (contracting) during a menstrual period. Dysmenorrhea may be mild, or it may be severe enough to interfere with everyday activities for a few days each month. Treatment depends on the cause of the dysmenorrhea. Work with your health care provider to determine what treatment or combination of treatments is best for you. This information is not intended to replace advice given to you by your  health care provider. Make sure you discuss any questions you have with your health care provider. Document Revised: 03/12/2020 Document Reviewed: 03/12/2020 Elsevier Patient Education  2022 [employer].     If you have been instructed to have an in-person evaluation today at a local Urgent Care facility, please use the link below. It will take you to a list of all of our available [facility], including address, phone number and hours of operation. Please do not delay care.  [facility]  If you or a family member do not have a primary care provider, use the link below to schedule a visit and establish care. When you choose a [facility] primary care physician or advanced practice provider, you gain a long-term partner in health. Find a Primary Care Provider  Learn more about [facility]'s in-office and virtual care options: [facility]

## 2021-07-17 NOTE — Progress Notes (Signed)
Virtual Visit Consent   [name], you are scheduled for a virtual visit with a [facility] provider today.     Just as with appointments in the office, your consent must be obtained to participate.  Your consent will be active for this visit and any virtual visit you may have with one of our providers in the next 365 days.     If you have a MyChart account, a copy of this consent can be sent to you electronically.  All virtual visits are billed to your insurance company just like a traditional visit in the office.    As this is a virtual visit, video technology does not allow for your provider to perform a traditional examination.  This may limit your provider's ability to fully assess your condition.  If your provider identifies any concerns that need to be evaluated in person or the need to arrange testing (such as labs, EKG, etc.), we will make arrangements to do so.     Although advances in technology are sophisticated, we cannot ensure that it will always work on either your end or our end.  If the connection with a video visit is poor, the visit may have to be switched to a telephone visit.  With either a video or telephone visit, we are not always able to ensure that we have a secure connection.     I need to obtain your verbal consent now.   Are you willing to proceed with your visit today?    Diana Robinson has provided verbal consent on 07/17/2021 for a virtual visit (video or telephone).   Mar Daring, PA-C   Date: 07/17/2021 1:12 PM   Virtual Visit via Video Note   I, Mar Daring, connected with  Diana Robinson  ([id number], [birth year]) on 07/17/21 at 12:15 PM EST by a video-enabled telemedicine application and verified that I am speaking with the correct person using two identifiers.  Location: Patient: Virtual Visit Location Patient: Home Provider: Virtual Visit Location Provider: Home Office   I discussed the limitations of evaluation and management by  telemedicine and the availability of in person appointments. The patient expressed understanding and agreed to proceed.    History of Present Illness: Diana Robinson is a 20 y.o. who identifies as a female who was assigned female at birth, and is being seen today for menorrhagia. Menstrual cycle started today with severe cramping. Has had this issue in the past. Denies any heavier than normal bleeding or clots. Reports her PCP had her on Naproxen 500mg  BID but that does not touch the pain any longer.    Problems:  Patient Active Problem List   Diagnosis Date Noted   Elevated BP without diagnosis of hypertension 05/26/2019   Frequent headaches 05/26/2019    Allergies: No Known Allergies Medications:  Current Outpatient Medications:    ibuprofen (ADVIL) 800 MG tablet, Take 1 tablet (800 mg total) by mouth every 8 (eight) hours as needed., Disp: 30 tablet, Rfl: 0   acetaminophen (TYLENOL) 325 MG tablet, Take 650 mg by mouth every 6 (six) hours as needed for moderate pain. (Patient not taking: Reported on 06/29/2021), Disp: , Rfl:    baclofen (LIORESAL) 10 MG tablet, Take 1 tablet (10 mg total) by mouth 2 (two) times daily., Disp: 10 each, Rfl: 0   metroNIDAZOLE (FLAGYL) 500 MG tablet, Take 1 tablet (500 mg total) by mouth 2 (two) times daily., Disp: 14 tablet, Rfl: 0  Observations/Objective: Patient is well-developed, well-nourished  in no acute distress.  Resting comfortably at home.  Head is normocephalic, atraumatic.  No labored breathing.  Speech is clear and coherent with logical content.  Patient is alert and oriented at baseline.    Assessment and Plan: 1. Dysmenorrhea - ibuprofen (ADVIL) 800 MG tablet; Take 1 tablet (800 mg total) by mouth every 8 (eight) hours as needed.  Dispense: 30 tablet; Refill: 0  - Ibuprofen provided - May add Magnesium sulfate 250mg  for cramping - Push fluids - Heat - Seek in person evaluation with PCP or GYN for other treatment options for menorrhagia  such as hormonal contraceptives  Follow Up Instructions: I discussed the assessment and treatment plan with the patient. The patient was provided an opportunity to ask questions and all were answered. The patient agreed with the plan and demonstrated an understanding of the instructions.  A copy of instructions were sent to the patient via MyChart unless otherwise noted below.    The patient was advised to call back or seek an in-person evaluation if the symptoms worsen or if the condition fails to improve as anticipated.  Time:  I spent 10 minutes with the patient via telehealth technology discussing the above problems/concerns.    Mar Daring, PA-C

## 2021-09-10 ENCOUNTER — Ambulatory Visit (HOSPITAL_COMMUNITY)
Admission: EM | Admit: 2021-09-10 | Discharge: 2021-09-10 | Disposition: A | Discharge status: Home / Self Care | Payer: Medicaid Other | Attending: Physician Assistant | Admitting: Physician Assistant

## 2021-09-10 ENCOUNTER — Other Ambulatory Visit: Payer: Self-pay

## 2021-09-10 ENCOUNTER — Emergency Department (HOSPITAL_COMMUNITY)
Admission: EM | Admit: 2021-09-10 | Discharge: 2021-09-11 | Disposition: A | Discharge status: Home / Self Care | Payer: Medicaid Other | Attending: Emergency Medicine | Admitting: Emergency Medicine

## 2021-09-10 ENCOUNTER — Encounter (HOSPITAL_COMMUNITY): Payer: Self-pay

## 2021-09-10 DIAGNOSIS — R42 Dizziness and giddiness: Secondary | ICD-10-CM | POA: Insufficient documentation

## 2021-09-10 DIAGNOSIS — R079 Chest pain, unspecified: Secondary | ICD-10-CM

## 2021-09-10 DIAGNOSIS — R0789 Other chest pain: Secondary | ICD-10-CM | POA: Diagnosis not present

## 2021-09-10 DIAGNOSIS — Z20822 Contact with and (suspected) exposure to covid-19: Secondary | ICD-10-CM | POA: Diagnosis not present

## 2021-09-10 LAB — COMPREHENSIVE METABOLIC PANEL
ALT: 15 U/L (ref 0–44)
AST: 18 U/L (ref 15–41)
Albumin: 4 g/dL (ref 3.5–5.0)
Alkaline Phosphatase: 83 U/L (ref 38–126)
Anion gap: 7 (ref 5–15)
BUN: 11 mg/dL (ref 6–20)
CO2: 26 mmol/L (ref 22–32)
Calcium: 9.7 mg/dL (ref 8.9–10.3)
Chloride: 105 mmol/L (ref 98–111)
Creatinine, Ser: 0.91 mg/dL (ref 0.44–1.00)
GFR, Estimated: >60 mL/min (ref >60)
Glucose, Bld: 104 mg/dL — ABNORMAL HIGH (ref 70–99)
Potassium: 3.8 mmol/L (ref 3.5–5.1)
Sodium: 138 mmol/L (ref 135–145)
Total Bilirubin: 0.8 mg/dL (ref 0.3–1.2)
Total Protein: 7.5 g/dL (ref 6.5–8.1)

## 2021-09-10 LAB — CBC WITH DIFFERENTIAL/PLATELET
Abs Immature Granulocytes: 0.01 10*3/uL (ref 0.00–0.07)
Basophils Absolute: 0 10*3/uL (ref 0.0–0.1)
Basophils Relative: 1 %
Eosinophils Absolute: 0 10*3/uL (ref 0.0–0.5)
Eosinophils Relative: 0 %
HCT: 38 % (ref 36.0–46.0)
Hemoglobin: 12.5 g/dL (ref 12.0–15.0)
Immature Granulocytes: 0 %
Lymphocytes Relative: 33 %
Lymphs Abs: 1.5 10*3/uL (ref 0.7–4.0)
MCH: 26.9 pg (ref 26.0–34.0)
MCHC: 32.9 g/dL (ref 30.0–36.0)
MCV: 81.7 fL (ref 80.0–100.0)
Monocytes Absolute: 0.4 10*3/uL (ref 0.1–1.0)
Monocytes Relative: 8 %
Neutro Abs: 2.7 10*3/uL (ref 1.7–7.7)
Neutrophils Relative %: 58 %
Platelets: 238 10*3/uL (ref 150–400)
RBC: 4.65 MIL/uL (ref 3.87–5.11)
RDW: 14.4 % (ref 11.5–15.5)
WBC: 4.7 10*3/uL (ref 4.0–10.5)
nRBC: 0 % (ref 0.0–0.2)

## 2021-09-10 LAB — POCT URINALYSIS DIPSTICK, ED / UC
Bilirubin Urine: NEGATIVE
Glucose, UA: NEGATIVE mg/dL
Hgb urine dipstick: NEGATIVE
Ketones, ur: NEGATIVE mg/dL
Leukocytes,Ua: NEGATIVE
Nitrite: NEGATIVE
Protein, ur: NEGATIVE mg/dL
Specific Gravity, Urine: 1.01 (ref 1.005–1.030)
Urobilinogen, UA: 0.2 mg/dL (ref 0.0–1.0)
pH: 6 (ref 5.0–8.0)

## 2021-09-10 LAB — CBG MONITORING, ED: Glucose-Capillary: 97 mg/dL (ref 70–99)

## 2021-09-10 LAB — POC URINE PREG, ED: Preg Test, Ur: NEGATIVE

## 2021-09-10 NOTE — ED Triage Notes (Signed)
Pt c/o dizziness x 2 days.   States she has had some nausea.  Denies V/D

## 2021-09-10 NOTE — ED Triage Notes (Signed)
Patient arrives from home with complaint of left sided chest pain that began today and dizziness x 2 days. Seen at [facility] and discharged earlier today.

## 2021-09-10 NOTE — Discharge Instructions (Signed)
I will contact you if your lab work is abnormal.  Please monitor your MyChart.  I would like you to go home and rest.  Make sure you are drinking plenty of fluid.  Please eat small frequent meals every few hours.  If it is reasonable to see cardiologist please call schedule an appointment.  I do recommend that you follow-up with your primary care provider first thing next week for reevaluation.  If anything worsens you must go to the emergency room including persistent lightheadedness, nausea/vomiting, weakness, chest pain, shortness of breath.

## 2021-09-10 NOTE — ED Notes (Signed)
Lavender save tube sent with troponin to lab.

## 2021-09-10 NOTE — ED Provider Notes (Signed)
[facility]    CSN: [id number] Arrival date & time: 09/10/21  0849      History   Chief Complaint Chief Complaint  Patient presents with   Dizziness    HPI Diana Robinson is a 21 y.o. female.   Patient presents today with a 2-day history of intermittent lightheadedness.  She reports symptoms are worse when she changes position but will also occur without identifiable trigger.  She describes this as a lightheaded sensation as if she is going to pass out but denies any associated syncope.  She reports associated nausea but denies additional symptoms including fever, chest pain, shortness of breath, vomiting, diarrhea, abdominal pain.  She does report beginning her menstrual cycle yesterday and does generally have a heavy menstrual cycles but denies any significant increase in menorrhagia.  Denies additional bleeding including melena or significant bruising.  She denies any recent head injury.  Denies any medication changes.  Reports that she has had a decreased appetite but otherwise has been eating and drinking normally.  She has not tried any over-the-counter medication for symptom management.   Past Medical History:  Diagnosis Date   Migraine 05/2019   Vitamin D deficiency 10/2020    Patient Active Problem List   Diagnosis Date Noted   Elevated BP without diagnosis of hypertension 05/26/2019   Frequent headaches 05/26/2019    History reviewed. No pertinent surgical history.  OB History     Gravida  0   Para  0   Term  0   Preterm  0   AB  0   Living  0      SAB  0   IAB  0   Ectopic  0   Multiple  0   Live Births  0            Home Medications    Prior to Admission medications   Medication Sig Start Date End Date Taking? Authorizing Provider  acetaminophen (TYLENOL) 325 MG tablet Take 650 mg by mouth every 6 (six) hours as needed for moderate pain. Patient not taking: Reported on 06/29/2021    [provider]  baclofen  (LIORESAL) 10 MG tablet Take 1 tablet (10 mg total) by mouth 2 (two) times daily. 06/04/21   Shunda Rabadi, Derry Skill, PA-C  ibuprofen (ADVIL) 800 MG tablet Take 1 tablet (800 mg total) by mouth every 8 (eight) hours as needed. 07/17/21   Mar Daring, PA-C  metroNIDAZOLE (FLAGYL) 500 MG tablet Take 1 tablet (500 mg total) by mouth 2 (two) times daily. 07/13/21   Lamptey, Myrene Galas, MD    Family History Family History  Problem Relation Age of Onset   Healthy Mother    Healthy Father     Social History Social History   Tobacco Use   Smoking status: Never   Smokeless tobacco: Never  Vaping Use   Vaping Use: Never used  Substance Use Topics   Alcohol use: Never   Drug use: Never     Allergies   Patient has no allergy information on record.   Review of Systems Review of Systems  Constitutional:  Positive for activity change. Negative for appetite change, fatigue and fever.  HENT:  Negative for congestion, sinus pressure, sneezing and sore throat.   Eyes:  Negative for photophobia and visual disturbance.  Respiratory:  Negative for cough and shortness of breath.   Cardiovascular:  Negative for chest pain.  Gastrointestinal:  Positive for nausea. Negative for abdominal pain, diarrhea  and vomiting.  Musculoskeletal:  Negative for arthralgias and myalgias.  Neurological:  Positive for light-headedness. Negative for dizziness, syncope, facial asymmetry, speech difficulty, weakness and headaches.    Physical Exam Triage Vital Signs ED Triage Vitals  Enc Vitals Group     BP 09/10/21 0911 (!) 153/94     Pulse Rate 09/10/21 0911 (!) 103     Resp 09/10/21 0911 18     Temp 09/10/21 0911 98.9 F (37.2 C)     Temp Source 09/10/21 0911 Oral     SpO2 09/10/21 0911 99 %     Weight --      Height --      Head Circumference --      Peak Flow --      Pain Score 09/10/21 0910 0     Pain Loc --      Pain Edu? --      Excl. in [place]? --    Orthostatic VS for the past 24 hrs:  BP- Lying  Pulse- Lying BP- Sitting Pulse- Sitting BP- Standing at 0 minutes Pulse- Standing at 0 minutes  09/10/21 1038 129/83 93 138/83 83 (!) 131/91 97    Updated Vital Signs BP (!) 153/94 (BP Location: Left Arm)    Pulse (!) 103    Temp 98.9 F (37.2 C) (Oral)    Resp 18    LMP 09/10/2021    SpO2 99%   Visual Acuity Right Eye Distance:   Left Eye Distance:   Bilateral Distance:    Right Eye Near:   Left Eye Near:    Bilateral Near:     Physical Exam Vitals reviewed.  Constitutional:      General: She is awake. She is not in acute distress.    Appearance: Normal appearance. She is well-developed. She is not ill-appearing.     Comments: Very pleasant female presented age no acute distress sitting comfortably in exam room  HENT:     Head: Normocephalic and atraumatic. No raccoon eyes, Battle's sign or contusion.     Right Ear: Tympanic membrane, ear canal and external ear normal. No hemotympanum.     Left Ear: Tympanic membrane, ear canal and external ear normal. No hemotympanum.     Mouth/Throat:     Tongue: Tongue does not deviate from midline.     Pharynx: Uvula midline. No oropharyngeal exudate or posterior oropharyngeal erythema.  Eyes:     Extraocular Movements: Extraocular movements intact.     Conjunctiva/sclera: Conjunctivae normal.     Pupils: Pupils are equal, round, and reactive to light.  Cardiovascular:     Rate and Rhythm: Normal rate and regular rhythm.     Heart sounds: Normal heart sounds, S1 normal and S2 normal. No murmur heard. Pulmonary:     Effort: Pulmonary effort is normal.     Breath sounds: Normal breath sounds. No wheezing, rhonchi or rales.     Comments: Clear to auscultation bilaterally Abdominal:     Palpations: Abdomen is soft.     Tenderness: There is no abdominal tenderness.  Musculoskeletal:     Cervical back: Normal range of motion and neck supple. No spinous process tenderness or muscular tenderness.     Comments: Strength 5/5 bilateral upper  and lower extremities  Lymphadenopathy:     Head:     Right side of head: No submental, submandibular or tonsillar adenopathy.     Left side of head: No submental, submandibular or tonsillar adenopathy.  Neurological:  General: No focal deficit present.     Mental Status: She is alert and oriented to person, place, and time.     Cranial Nerves: Cranial nerves 2-12 are intact.     Motor: Motor function is intact.     Coordination: Coordination is intact. Romberg sign negative. Rapid alternating movements normal.     Gait: Gait is intact.  Psychiatric:        Behavior: Behavior is cooperative.     UC Treatments / Results  Labs (all labs ordered are listed, but only abnormal results are displayed) Labs Reviewed  CBC WITH DIFFERENTIAL/PLATELET  COMPREHENSIVE METABOLIC PANEL  POCT URINALYSIS DIPSTICK, ED / UC  POC URINE PREG, ED  CBG MONITORING, ED    EKG   Radiology No results found.  Procedures Procedures (including critical care time)  Medications Ordered in UC Medications - No data to display  Initial Impression / Assessment and Plan / UC Course  I have reviewed the triage vital signs and the nursing notes.  Pertinent labs & imaging results that were available during my care of the patient were reviewed by me and considered in my medical decision making (see chart for details).     Vital signs and physical exam reassuring today; no indication for emergent evaluation or imaging.  EKG obtained showed normal sinus rhythm with ventricular rate of 78 bpm without ischemic changes; compared to 07/10/2021 tracing nonspecific ST changes in aVL.  UA was normal with no evidence of dehydration.  Urine pregnancy was negative.  Capillary glucose was appropriate.  Orthostatic vital signs without significant hypotension.  CBC and CMP are pending.  We will contact patient if labs reveal significant abnormalities that require change in treatment plan.  Recommended that she rest and  drink plenty of fluid.  Encouraged her to eat small frequent meals.  She was provided work excuse note for several days.  Recommended follow-up with PCP as soon as possible for further evaluation and management.  Discussed that if she has any worsening symptoms including chest pain, syncopal episode, persistent lightheadedness, nausea/vomiting, weakness she needs to go to the emergency room.  Strict return precautions given to which she expressed understanding.  Final Clinical Impressions(s) / UC Diagnoses   Final diagnoses:  Episodic lightheadedness     Discharge Instructions      I will contact you if your lab work is abnormal.  Please monitor your MyChart.  I would like you to go home and rest.  Make sure you are drinking plenty of fluid.  Please eat small frequent meals every few hours.  If it is reasonable to see cardiologist please call schedule an appointment.  I do recommend that you follow-up with your primary care provider first thing next week for reevaluation.  If anything worsens you must go to the emergency room including persistent lightheadedness, nausea/vomiting, weakness, chest pain, shortness of breath.     ED Prescriptions   None    PDMP not reviewed this encounter.   Terrilee Croak, PA-C 09/10/21 1056

## 2021-09-11 ENCOUNTER — Emergency Department (HOSPITAL_COMMUNITY): Payer: Medicaid Other

## 2021-09-11 LAB — RESP PANEL BY RT-PCR (FLU A&B, COVID) ARPGX2
Influenza A by PCR: NEGATIVE
Influenza B by PCR: NEGATIVE
SARS Coronavirus 2 by RT PCR: NEGATIVE

## 2021-09-11 LAB — TROPONIN I (HIGH SENSITIVITY): Troponin I (High Sensitivity): 2 ng/L (ref <18)

## 2021-09-11 NOTE — ED Provider Notes (Signed)
[facility] Emergency Department Provider Note MRN:  [id number]  Arrival date & time: 09/11/21     Chief Complaint   Chest Pain and Dizziness   History of Present Illness   Diana Robinson is a 21 y.o. year-old female with no pertinent past medical presenting to the ED with chief complaint of chest pain and dizziness.  Dizziness for the past 2 or 3 days, described as a lightheadedness, worse with standing up.  Was evaluated at urgent care yesterday.  Had some chest tightness this evening and wanted to be reevaluated.  No shortness of breath, no leg pain or swelling, does not take birth control pills.  Review of Systems  A thorough review of systems was obtained and all systems are negative except as noted in the HPI and PMH.   Patient's Health History    Past Medical History:  Diagnosis Date   Migraine 05/2019   Vitamin D deficiency 10/2020    History reviewed. No pertinent surgical history.  Family History  Problem Relation Age of Onset   Healthy Mother    Healthy Father     Social History   Socioeconomic History   Marital status: Single    Spouse name: Not on file   Number of children: Not on file   Years of education: Not on file   Highest education level: Not on file  Occupational History   Not on file  Tobacco Use   Smoking status: Never   Smokeless tobacco: Never  Vaping Use   Vaping Use: Never used  Substance and Sexual Activity   Alcohol use: Never   Drug use: Never   Sexual activity: Yes    Birth control/protection: None  Other Topics Concern   Not on file  Social History Narrative   Not on file   Social Determinants of Health   Financial Resource Strain: Not on file  Food Insecurity: Not on file  Transportation Needs: Not on file  Physical Activity: Not on file  Stress: Not on file  Social Connections: Not on file  Intimate Partner Violence: Not on file     Physical Exam   Vitals:   09/10/21 2336  BP: 139/89   Pulse: 79  Resp: 18  Temp: 98.4 F (36.9 C)  SpO2: 100%    CONSTITUTIONAL: Well-appearing, NAD NEURO/PSYCH:  Alert and oriented x 3, no focal deficits EYES:  eyes equal and reactive ENT/NECK:  no LAD, no JVD CARDIO: Regular rate, well-perfused, normal S1 and S2 PULM:  CTAB no wheezing or rhonchi GI/GU:  non-distended, non-tender MSK/SPINE:  No gross deformities, no edema SKIN:  no rash, atraumatic   *Additional and/or pertinent findings included in MDM below  Diagnostic and Interventional Summary    EKG Interpretation  Date/Time:  Thursday September 10 2021 23:40:36 EST Ventricular Rate:  75 PR Interval:  164 QRS Duration: 99 QT Interval:  366 QTC Calculation: 409 R Axis:   64 Text Interpretation: Sinus rhythm Confirmed by Gerlene Fee [phone]) on 09/11/2021 12:38:13 AM       Labs Reviewed  RESP PANEL BY RT-PCR (FLU A&B, COVID) [id number]  TROPONIN I (HIGH SENSITIVITY)    DG Chest 2 View  Final Result      Medications - No data to display   Procedures  /  Critical Care Procedures  ED Course and Medical Decision Making  Initial Impression and Ddx Suspect orthostatic hypotension, also suspect a stress or anxiety component, patient having some stresses related to school.  She  is sitting comfortably no acute distress with normal vital signs, currently not having chest pain.  PERC negative, highly doubt PE.  Labs obtained at urgent care are largely unremarkable.  Past medical/surgical history that increases complexity of ED encounter: None  Interpretation of Diagnostics I personally reviewed the EKG and my interpretation is as follows: Sinus rhythm with normal intervals    Troponin negative  Patient Reassessment and Ultimate Disposition/Management Discharge home with reassurance, return precautions for worsening chest pain or shortness of breath.  Patient management required discussion with the following services or consulting groups:  None  Complexity of  Problems Addressed Acute complicated illness or Injury  Additional Data Reviewed and Analyzed Further history obtained from: Recent PCP notes  Factors Impacting ED Encounter Risk None  Barth Kirks. Sedonia Small, [place] [email or web address]  Final Clinical Impressions(s) / ED Diagnoses     ICD-10-CM   1. Lightheadedness  R42     2. Chest pain  R07.9 DG Chest 2 View    DG Chest 2 View      ED Discharge Orders     None        Discharge Instructions Discussed with and Provided to Patient:    Discharge Instructions      You were evaluated in the Emergency Department and after careful evaluation, we did not find any emergent condition requiring admission or further testing in the hospital.  Your exam/testing today is overall reassuring.  Recommend continued rest, fluids, and possibly talking to someone about your stresses.  Please return to the Emergency Department if you experience any worsening of your condition.   Thank you for allowing [place] to be a part of your care.      Maudie Flakes, MD 09/11/21 [phone]

## 2021-09-11 NOTE — Discharge Instructions (Signed)
You were evaluated in the Emergency Department and after careful evaluation, we did not find any emergent condition requiring admission or further testing in the hospital.  Your exam/testing today is overall reassuring.  Recommend continued rest, fluids, and possibly talking to someone about your stresses.  Please return to the Emergency Department if you experience any worsening of your condition.   Thank you for allowing [place] to be a part of your care.

## 2021-10-26 ENCOUNTER — Encounter: Payer: Medicaid Other | Admitting: Nurse Practitioner

## 2021-10-26 ENCOUNTER — Encounter: Payer: Medicaid Other | Admitting: Family Medicine

## 2021-11-04 ENCOUNTER — Ambulatory Visit (INDEPENDENT_AMBULATORY_CARE_PROVIDER_SITE_OTHER): Payer: Medicaid Other | Admitting: Nurse Practitioner

## 2021-11-04 ENCOUNTER — Other Ambulatory Visit: Payer: Self-pay

## 2021-11-04 ENCOUNTER — Encounter: Payer: Self-pay | Admitting: Nurse Practitioner

## 2021-11-04 VITALS — BP 142/92 | HR 86 | Temp 98.3°F | Wt 159.0 lb

## 2021-11-04 DIAGNOSIS — Z0000 Encounter for general adult medical examination without abnormal findings: Secondary | ICD-10-CM

## 2021-11-04 DIAGNOSIS — N946 Dysmenorrhea, unspecified: Secondary | ICD-10-CM | POA: Diagnosis not present

## 2021-11-04 DIAGNOSIS — N943 Premenstrual tension syndrome: Secondary | ICD-10-CM | POA: Diagnosis not present

## 2021-11-04 DIAGNOSIS — R0602 Shortness of breath: Secondary | ICD-10-CM

## 2021-11-04 MED ORDER — CYCLOBENZAPRINE HCL 10 MG PO TABS: 10.0000 mg | ORAL_TABLET | Freq: Three times a day (TID) | ORAL | 0 refills | Status: DC | PRN

## 2021-11-04 NOTE — Patient Instructions (Signed)
You were seen today in the [facility] for wellness visit and menstrual cramps. Labs were collected, results will be available via MyChart or, if abnormal, you will be contacted by clinic staff. You were prescribed medications, please take as directed. Please follow up in 3 mths for reevaluation of symptoms  ?

## 2021-11-04 NOTE — Progress Notes (Signed)
? ?[facility] ?[place],   [address] ?Phone:  [phone]   Fax:  [phone] ?Subjective:  ? Patient ID: Diana Robinson, female    DOB: [birth year], 21 y.o.   MRN: [id number] ? ?Chief Complaint  ?Patient presents with  ? Annual Exam  ?  Pt stated she has bad menstrual cramps.  ? ?HPI ?Diana Robinson 21 y.o. female  has a past medical history of Migraine (05/2019) and Vitamin D deficiency (10/2020). To the [facility] for wellness visit and menstrual cramps.  ? ?States that she has significant menstrual cramps with every cycle. Has tried OTC medications with no improvement in symptoms. States that she has taken muscle relaxants in the past with marked improvement in symptoms. Requesting additional prescription of muscle relaxants.  ? ?Also concerned about having difficulty breathing when laying on back or stomach. Has had for several months inconsistently, but symptoms have become more consistent in the past week. Symptoms correlate with menstrual cycle. Endorses having heavy bleeding during mestrual cycle. Denies any pain in extremities. Deneis any recent trauma or injury ? 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 Has a balanced diet and exercises 4 days per week in the gym. Denies any other concerns today. ? ?Denies any fever. Denies any chest pain, HA or dizziness. Denies any blurred vision, numbness or tingling. ? ? ?Past Medical History:  ?Diagnosis Date  ? Migraine 05/2019  ? Vitamin D deficiency 10/2020  ? ? ?History reviewed. No pertinent surgical history. ? ?Family History  ?Problem Relation Age of Onset  ? Healthy Mother   ? Healthy Father   ? ? ?Social History  ? ?Socioeconomic History  ? Marital status: Single  ?  Spouse name: Not on file  ? Number of children: Not on file  ? Years of education: Not on file  ? Highest education level: Not on file  ?Occupational History  ? Not on file  ?Tobacco Use  ? Smoking status: Never  ? Smokeless  tobacco: Never  ?Vaping Use  ? Vaping Use: Never used  ?Substance and Sexual Activity  ? Alcohol use: Never  ? Drug use: Never  ? Sexual activity: Yes  ?  Birth control/protection: None  ?Other Topics Concern  ? Not on file  ?Social History Narrative  ? Not on file  ? ?Social Determinants of Health  ? ?Financial Resource Strain: Not on file  ?Food Insecurity: Not on file  ?Transportation Needs: Not on file  ?Physical Activity: Not on file  ?Stress: Not on file  ?Social Connections: Not on file  ?Intimate Partner Violence: Not on file  ? ? ?Outpatient Medications Prior to Visit  ?Medication Sig Dispense Refill  ? acetaminophen (TYLENOL) 325 MG tablet Take 650 mg by mouth every 6 (six) hours as needed for moderate pain. (Patient not taking: Reported on 06/29/2021)    ? baclofen (LIORESAL) 10 MG tablet Take 1 tablet (10 mg total) by mouth 2 (two) times daily. (Patient not taking: Reported on 11/04/2021) 10 each 0  ? ibuprofen (ADVIL) 800 MG tablet Take 1 tablet (800 mg total) by mouth every 8 (eight) hours as needed. (Patient not taking: Reported on 11/04/2021) 30 tablet 0  ? metroNIDAZOLE (FLAGYL) 500 MG tablet Take 1 tablet (500 mg total) by mouth 2 (two) times daily. (Patient not taking: Reported on 11/04/2021) 14 tablet 0  ? ?No facility-administered medications prior to visit.  ? ? ?Not on File ? ?Review of  Systems  ?Constitutional:  Negative for chills, fever and malaise/fatigue.  ?HENT: Negative.    ?Eyes: Negative.   ?Respiratory:  Positive for shortness of breath. Negative for cough.   ?Cardiovascular:  Negative for chest pain, palpitations and leg swelling.  ?Gastrointestinal:  Negative for abdominal pain, blood in stool, constipation, diarrhea, nausea and vomiting.  ?Genitourinary:  Negative for dysuria, flank pain, frequency, hematuria and urgency.  ?     See HPI  ?Musculoskeletal: Negative.   ?Skin: Negative.   ?Neurological: Negative.   ?Psychiatric/Behavioral:  Negative for depression. The patient is not  nervous/anxious.   ?All other systems reviewed and are negative. ? ?   ?Objective:  ?  ?Physical Exam ?Constitutional:   ?   General: She is not in acute distress. ?   Appearance: Normal appearance. She is normal weight.  ?HENT:  ?   Head: Normocephalic.  ?   Right Ear: Tympanic membrane, ear canal and external ear normal. There is no impacted cerumen.  ?   Left Ear: Tympanic membrane, ear canal and external ear normal. There is no impacted cerumen.  ?   Nose: Nose normal. No congestion or rhinorrhea.  ?   Mouth/Throat:  ?   Mouth: Mucous membranes are moist.  ?   Pharynx: Oropharynx is clear. No oropharyngeal exudate or posterior oropharyngeal erythema.  ?Eyes:  ?   General: No scleral icterus.    ?   Right eye: No discharge.     ?   Left eye: No discharge.  ?   Extraocular Movements: Extraocular movements intact.  ?   Conjunctiva/sclera: Conjunctivae normal.  ?   Pupils: Pupils are equal, round, and reactive to light.  ?Neck:  ?   Vascular: No carotid bruit.  ?Cardiovascular:  ?   Rate and Rhythm: Normal rate and regular rhythm.  ?   Pulses: Normal pulses.  ?   Heart sounds: Normal heart sounds.  ?   Comments: No obvious peripheral edema ?Pulmonary:  ?   Effort: Pulmonary effort is normal.  ?   Breath sounds: Normal breath sounds.  ?Abdominal:  ?   General: Abdomen is flat. Bowel sounds are normal. There is no distension.  ?   Palpations: Abdomen is soft. There is no mass.  ?   Tenderness: There is no abdominal tenderness. There is no right CVA tenderness, left CVA tenderness, guarding or rebound.  ?   Hernia: No hernia is present.  ?Musculoskeletal:     ?   General: No swelling, tenderness, deformity or signs of injury. Normal range of motion.  ?   Cervical back: Normal range of motion and neck supple. No rigidity or tenderness.  ?   Right lower leg: No edema.  ?   Left lower leg: No edema.  ?Lymphadenopathy:  ?   Cervical: No cervical adenopathy.  ?Skin: ?   General: Skin is warm and dry.  ?   Capillary Refill:  Capillary refill takes less than 2 seconds.  ?Neurological:  ?   General: No focal deficit present.  ?   Mental Status: She is alert and oriented to person, place, and time.  ?Psychiatric:     ?   Mood and Affect: Mood normal.     ?   Behavior: Behavior normal.     ?   Thought Content: Thought content normal.     ?   Judgment: Judgment normal.  ? ? ?BP (!) 142/92 (BP Location: Left Arm, Cuff Size: Normal)   Pulse 86  Temp 98.3 ?F (36.8 ?C)   Wt 159 lb (72.1 kg)   LMP 11/01/2021 (Exact Date)   SpO2 100%   BMI 25.66 kg/m?  ?Wt Readings from Last 3 Encounters:  ?11/04/21 159 lb (72.1 kg)  ?09/10/21 140 lb (63.5 kg)  ?06/29/21 145 lb (65.8 kg)  ? ? ? ?There is no immunization history on file for this patient. ? ?Diabetic Foot Exam - Simple   ?No data filed ?  ? ? ?No results found for: TSH ?Lab Results  ?Component Value Date  ? WBC 3.7 11/04/2021  ? HGB 11.3 11/04/2021  ? HCT 35.6 11/04/2021  ? MCV 83 11/04/2021  ? PLT 229 11/04/2021  ? ?Lab Results  ?Component Value Date  ? NA 142 11/04/2021  ? K 3.9 11/04/2021  ? CO2 21 11/04/2021  ? GLUCOSE 113 (H) 11/04/2021  ? BUN 12 11/04/2021  ? CREATININE 0.86 11/04/2021  ? BILITOT 0.3 11/04/2021  ? ALKPHOS 96 11/04/2021  ? AST 21 11/04/2021  ? ALT 20 11/04/2021  ? PROT 6.9 11/04/2021  ? ALBUMIN 4.0 11/04/2021  ? CALCIUM 9.2 11/04/2021  ? ANIONGAP 7 09/10/2021  ? EGFR 99 11/04/2021  ? ?Lab Results  ?Component Value Date  ? CHOL 158 11/04/2021  ? ?Lab Results  ?Component Value Date  ? HDL 73 11/04/2021  ? ?Lab Results  ?Component Value Date  ? Bryan 76 11/04/2021  ? ?Lab Results  ?Component Value Date  ? TRIG 39 11/04/2021  ? ?Lab Results  ?Component Value Date  ? CHOLHDL 2.2 11/04/2021  ? ?No results found for: HGBA1C ? ?   ?Assessment & Plan:  ? ?Problem List Items Addressed This Visit   ?None ?Visit Diagnoses   ? ? Well adult exam    -  Primary  ? Relevant Orders  ? CBC with Differential/Platelet (Completed)  ? Comprehensive metabolic panel (Completed)  ? Lipid panel  (Completed) ?Encouraged continued diet and exercise efforts  ? Menstrual cramps      ? Relevant Medications  ? cyclobenzaprine (FLEXERIL) 10 MG tablet, initiated during visit  ?Discussed non pharmacological methods

## 2021-11-05 LAB — COMPREHENSIVE METABOLIC PANEL
ALT: 20 IU/L (ref 0–32)
AST: 21 IU/L (ref 0–40)
Albumin/Globulin Ratio: 1.4 (ref 1.2–2.2)
Albumin: 4 g/dL (ref 3.9–5.0)
Alkaline Phosphatase: 96 IU/L (ref 44–121)
BUN/Creatinine Ratio: 14 (ref 9–23)
BUN: 12 mg/dL (ref 6–20)
Bilirubin Total: 0.3 mg/dL (ref 0.0–1.2)
CO2: 21 mmol/L (ref 20–29)
Calcium: 9.2 mg/dL (ref 8.7–10.2)
Chloride: 107 mmol/L — ABNORMAL HIGH (ref 96–106)
Creatinine, Ser: 0.86 mg/dL (ref 0.57–1.00)
Globulin, Total: 2.9 g/dL (ref 1.5–4.5)
Glucose: 113 mg/dL — ABNORMAL HIGH (ref 70–99)
Potassium: 3.9 mmol/L (ref 3.5–5.2)
Sodium: 142 mmol/L (ref 134–144)
Total Protein: 6.9 g/dL (ref 6.0–8.5)
eGFR: 99 mL/min/{1.73_m2} (ref >59)

## 2021-11-05 LAB — CBC WITH DIFFERENTIAL/PLATELET
Basophils Absolute: 0.1 10*3/uL (ref 0.0–0.2)
Basos: 1 %
EOS (ABSOLUTE): 0.1 10*3/uL (ref 0.0–0.4)
Eos: 2 %
Hematocrit: 35.6 % (ref 34.0–46.6)
Hemoglobin: 11.3 g/dL (ref 11.1–15.9)
Immature Grans (Abs): 0 10*3/uL (ref 0.0–0.1)
Immature Granulocytes: 0 %
Lymphocytes Absolute: 2 10*3/uL (ref 0.7–3.1)
Lymphs: 54 %
MCH: 26.3 pg — ABNORMAL LOW (ref 26.6–33.0)
MCHC: 31.7 g/dL (ref 31.5–35.7)
MCV: 83 fL (ref 79–97)
Monocytes Absolute: 0.4 10*3/uL (ref 0.1–0.9)
Monocytes: 10 %
Neutrophils Absolute: 1.2 10*3/uL — ABNORMAL LOW (ref 1.4–7.0)
Neutrophils: 33 %
Platelets: 229 10*3/uL (ref 150–450)
RBC: 4.3 x10E6/uL (ref 3.77–5.28)
RDW: 14.1 % (ref 11.7–15.4)
WBC: 3.7 10*3/uL (ref 3.4–10.8)

## 2021-11-05 LAB — LIPID PANEL
Chol/HDL Ratio: 2.2 ratio (ref 0.0–4.4)
Cholesterol, Total: 158 mg/dL (ref 100–199)
HDL: 73 mg/dL (ref >39)
LDL Chol Calc (NIH): 76 mg/dL (ref 0–99)
Triglycerides: 39 mg/dL (ref 0–149)
VLDL Cholesterol Cal: 9 mg/dL (ref 5–40)

## 2021-11-27 ENCOUNTER — Other Ambulatory Visit: Payer: Self-pay

## 2021-11-27 ENCOUNTER — Ambulatory Visit (HOSPITAL_COMMUNITY)
Admission: EM | Admit: 2021-11-27 | Discharge: 2021-11-27 | Disposition: A | Discharge status: Home / Self Care | Payer: Medicaid Other

## 2021-11-27 ENCOUNTER — Encounter (HOSPITAL_COMMUNITY): Payer: Self-pay | Admitting: Emergency Medicine

## 2021-11-27 DIAGNOSIS — R002 Palpitations: Secondary | ICD-10-CM | POA: Diagnosis not present

## 2021-11-27 DIAGNOSIS — H1013 Acute atopic conjunctivitis, bilateral: Secondary | ICD-10-CM | POA: Diagnosis not present

## 2021-11-27 NOTE — ED Provider Notes (Signed)
?[facility] ? ? ? ?CSN: [id number] ?Arrival date & time: 11/27/21  1055 ? ? ?  ? ?History   ?Chief Complaint ?Chief Complaint  ?Patient presents with  ? Chest Pain  ? Allergies  ? ? ?HPI ?Diana Robinson is a 21 y.o. female. Pt reports intermittent rapid heart rate for last several months. Typically occurs around twice a month but has occurred the last two nights in a row, which is unusual for her.  Rapid heart rate lasted for about 1 to 2 minutes and then resolve spontaneously.  Typically occurs at night when she is trying to sleep.  Denies chest pain or chest pressure but does describe chest discomfort the longer the rapid heart rate last.  Feels like shortness of breath occurs with it.  Runs for 20 minutes on a treadmill at least 3 times a week and never experiences symptoms when exercising. Denies nicotine use or caffeine use.  Does report increased stress as she is about to graduate from college and is also working an increased number of hours.  Does not know how to check her pulse. Has no symptoms at this time.  ? ?Also reports seasonal allergies.  Has been taking Claritin or Zyrtec (not both at the same time) with minimal relief of symptoms.  Symptoms include eye itching, watery eyes, and eyes glued together in the morning.  Also reports itchy skin in the evenings.  Has been using Pataday for her eyes with no relief. ? ? ?Chest Pain ?Associated symptoms: palpitations   ? ?Past Medical History:  ?Diagnosis Date  ? Migraine 05/2019  ? Vitamin D deficiency 10/2020  ? ? ?Patient Active Problem List  ? Diagnosis Date Noted  ? Elevated BP without diagnosis of hypertension 05/26/2019  ? Frequent headaches 05/26/2019  ? ? ?History reviewed. No pertinent surgical history. ? ?OB History   ? ? Gravida  ?0  ? Para  ?0  ? Term  ?0  ? Preterm  ?0  ? AB  ?0  ? Living  ?0  ?  ? ? SAB  ?0  ? IAB  ?0  ? Ectopic  ?0  ? Multiple  ?0  ? Live Births  ?0  ?   ?  ?  ? ? ? ?Home Medications   ? ?Prior to Admission medications    ?Medication Sig Start Date End Date Taking? Authorizing Provider  ?acetaminophen (TYLENOL) 325 MG tablet Take 650 mg by mouth every 6 (six) hours as needed for moderate pain. ?Patient not taking: Reported on 06/29/2021    [provider]  ?baclofen (LIORESAL) 10 MG tablet Take 1 tablet (10 mg total) by mouth 2 (two) times daily. ?Patient not taking: Reported on 11/04/2021 06/04/21   Raspet, Derry Skill, PA-C  ?cyclobenzaprine (FLEXERIL) 10 MG tablet Take 1 tablet (10 mg total) by mouth 3 (three) times daily as needed for muscle spasms. 11/04/21   Bo Merino I, NP  ?ibuprofen (ADVIL) 800 MG tablet Take 1 tablet (800 mg total) by mouth every 8 (eight) hours as needed. ?Patient not taking: Reported on 11/04/2021 07/17/21   Mar Daring, PA-C  ?metroNIDAZOLE (FLAGYL) 500 MG tablet Take 1 tablet (500 mg total) by mouth 2 (two) times daily. ?Patient not taking: Reported on 11/04/2021 07/13/21   Chase Picket, MD  ? ? ?Family History ?Family History  ?Problem Relation Age of Onset  ? Healthy Mother   ? Healthy Father   ? ? ?Social History ?Social History  ? ?  Tobacco Use  ? Smoking status: Never  ? Smokeless tobacco: Never  ?Vaping Use  ? Vaping Use: Never used  ?Substance Use Topics  ? Alcohol use: Never  ? Drug use: Never  ? ? ? ?Allergies   ?Patient has no allergy information on record. ? ? ?Review of Systems ?Review of Systems  ?Eyes:  Positive for discharge and itching. Negative for pain and redness.  ?Cardiovascular:  Positive for palpitations. Negative for chest pain.  ?Skin:  Negative for rash.  ?     Pruritis.   ? ? ?Physical Exam ?Triage Vital Signs ?ED Triage Vitals  ?Enc Vitals Group  ?   BP 11/27/21 1111 (!) 135/91  ?   Pulse Rate 11/27/21 1111 87  ?   Resp 11/27/21 1111 16  ?   Temp 11/27/21 1111 98.5 ?F (36.9 ?C)  ?   Temp Source 11/27/21 1111 Oral  ?   SpO2 11/27/21 1111 100 %  ?   Weight 11/27/21 1110 158 lb 15.2 oz (72.1 kg)  ?   Height 11/27/21 1110 '5\' 6"'$  (1.676 m)  ?   Head Circumference  --   ?   Peak Flow --   ?   Pain Score 11/27/21 1110 0  ?   Pain Loc --   ?   Pain Edu? --   ?   Excl. in [place]? --   ? ?No data found. ? ?Updated Vital Signs ?BP (!) 135/91 (BP Location: Left Arm)   Pulse 87   Temp 98.5 ?F (36.9 ?C) (Oral)   Resp 16   Ht '5\' 6"'$  (1.676 m)   Wt 158 lb 15.2 oz (72.1 kg)   LMP 11/01/2021 (Exact Date)   SpO2 100%   BMI 25.66 kg/m?  ? ?Visual Acuity ?Right Eye Distance:   ?Left Eye Distance:   ?Bilateral Distance:   ? ?Right Eye Near:   ?Left Eye Near:    ?Bilateral Near:    ? ?Physical Exam ?Constitutional:   ?   General: She is not in acute distress. ?   Appearance: She is well-developed. She is not ill-appearing.  ?Eyes:  ?   Conjunctiva/sclera:  ?   Right eye: Right conjunctiva is not injected. No exudate. ?   Left eye: Left conjunctiva is not injected. No exudate. ?Cardiovascular:  ?   Rate and Rhythm: Normal rate and regular rhythm. No extrasystoles are present. ?   Chest Wall: PMI is not displaced.  ?   Pulses:     ?     Radial pulses are 2+ on the right side and 2+ on the left side.  ?     Dorsalis pedis pulses are 2+ on the right side and 2+ on the left side.  ?   Comments: No carotid bruit.  ?Musculoskeletal:  ?   Right lower leg: No edema.  ?   Left lower leg: No edema.  ?Neurological:  ?   Mental Status: She is alert.  ? ? ? ?UC Treatments / Results  ?Labs ?(all labs ordered are listed, but only abnormal results are displayed) ?Labs Reviewed - No data to display ? ?EKG ? ? ?Radiology ?No results found. ? ?Procedures ?Procedures (including critical care time) ? ?Medications Ordered in UC ?Medications - No data to display ? ?Initial Impression / Assessment and Plan / UC Course  ?I have reviewed the triage vital signs and the nursing notes. ? ?Pertinent labs & imaging results that were available during my care of the  patient were reviewed by me and considered in my medical decision making (see chart for details). ? ?  ? ?Pt taught to take pulse and determine if rhythm  regular or irregular. Pt to keep a log of when rapid heart rate sensation occurs and document HR and if rhythm regular or not and take to her PCP. We discussed symptoms/reasons for seeking emergency care.  Patient to try Opcon-A and Benadryl for allergy symptoms as Claritin or Zyrtec are and Pataday not working. ? ? ?Final Clinical Impressions(s) / UC Diagnoses  ? ?Final diagnoses:  ?Allergic conjunctivitis of both eyes  ?Palpitations  ? ? ? ?Discharge Instructions   ? ?  ?Continue taking claritin or zyrtec (not both) daily to help manage your allergies.  If that is not sufficient you can also take a children's dose of liquid Benadryl (12.5 mg is a children's dose; adult dose is 25 mg).  Try using Opcon-A for your eye allergies. ? ?Keep a diary/log of when you experience palpitations.  When you experience palpitations, check your heart rate for 60 seconds (or for 15 seconds multiplied by 4) and write down how many beats per minute.  Also when he experienced palpitations, pay attention to whether or not your heart beat is regular in rhythm or irregular in rhythm.  Let your primary care provider know how often you are having symptoms, help faster heartbeats, and whether it is regular or not. ? ? ?ED Prescriptions   ?None ?  ? ?PDMP not reviewed this encounter. ?  ?Carvel Getting, NP ?11/27/21 1216 ? ?

## 2021-11-27 NOTE — ED Triage Notes (Addendum)
Pt reports intermittent chest pain for the past month. States the chest pain has increased within the past week and only occurs when lying down at night. Denies active chest pain at this time.   ?Also reports allergies. States have tried Claritin and zyrtec without relief.  ?

## 2021-11-27 NOTE — Discharge Instructions (Addendum)
Continue taking claritin or zyrtec (not both) daily to help manage your allergies.  If that is not sufficient you can also take a children's dose of liquid Benadryl (12.5 mg is a children's dose; adult dose is 25 mg).  Try using Opcon-A for your eye allergies. ? ?Keep a diary/log of when you experience palpitations.  When you experience palpitations, check your heart rate for 60 seconds (or for 15 seconds multiplied by 4) and write down how many beats per minute.  Also when he experienced palpitations, pay attention to whether or not your heart beat is regular in rhythm or irregular in rhythm.  Let your primary care provider know how often you are having symptoms, help faster heartbeats, and whether it is regular or not. ?

## 2021-12-25 ENCOUNTER — Telehealth: Payer: Medicaid Other | Admitting: Emergency Medicine

## 2021-12-25 DIAGNOSIS — N760 Acute vaginitis: Secondary | ICD-10-CM | POA: Diagnosis not present

## 2021-12-25 MED ORDER — METRONIDAZOLE 500 MG PO TABS: 500.0000 mg | ORAL_TABLET | Freq: Two times a day (BID) | ORAL | 0 refills | Status: DC

## 2021-12-25 NOTE — Progress Notes (Signed)
Virtual Visit Consent   Diana Robinson, you are scheduled for a virtual visit with a [facility] provider today. Just as with appointments in the office, your consent must be obtained to participate. Your consent will be active for this visit and any virtual visit you may have with one of our providers in the next 365 days. If you have a MyChart account, a copy of this consent can be sent to you electronically.  As this is a virtual visit, video technology does not allow for your provider to perform a traditional examination. This may limit your provider's ability to fully assess your condition. If your provider identifies any concerns that need to be evaluated in person or the need to arrange testing (such as labs, EKG, etc.), we will make arrangements to do so. Although advances in technology are sophisticated, we cannot ensure that it will always work on either your end or our end. If the connection with a video visit is poor, the visit may have to be switched to a telephone visit. With either a video or telephone visit, we are not always able to ensure that we have a secure connection.  By engaging in this virtual visit, you consent to the provision of healthcare and authorize for your insurance to be billed (if applicable) for the services provided during this visit. Depending on your insurance coverage, you may receive a charge related to this service.  I need to obtain your verbal consent now. Are you willing to proceed with your visit today? Jenai Scaletta has provided verbal consent on 12/25/2021 for a virtual visit (video or telephone). Montine Circle, PA-C  Date: 12/25/2021 11:50 AM  Virtual Visit via Video Note   I, Montine Circle, connected with  Diana Robinson  ([id number], November 24, 2000) on 12/25/21 at 11:45 AM EDT by a video-enabled telemedicine application and verified that I am speaking with the correct person using two identifiers.  Location: Patient: Virtual Visit Location Patient:  Home Provider: Virtual Visit Location Provider: Home Office   I discussed the limitations of evaluation and management by telemedicine and the availability of in person appointments. The patient expressed understanding and agreed to proceed.    History of Present Illness: Diana Robinson is a 21 y.o. who identifies as a female who was assigned female at birth, and is being seen today for vaginal discharge.  Reports hx of BV.  Last time was in December.  Reports being Rx'd abx with good success.  Denies pregnancy or breastfeeding.  Denies any abdominal pain.  Denies any other associated symptoms.  HPI: HPI  Problems:  Patient Active Problem List   Diagnosis Date Noted   Elevated BP without diagnosis of hypertension 05/26/2019   Frequent headaches 05/26/2019    Allergies: Not on File Medications:  Current Outpatient Medications:    metroNIDAZOLE (FLAGYL) 500 MG tablet, Take 1 tablet (500 mg total) by mouth 2 (two) times daily., Disp: 14 tablet, Rfl: 0   acetaminophen (TYLENOL) 325 MG tablet, Take 650 mg by mouth every 6 (six) hours as needed for moderate pain. (Patient not taking: Reported on 06/29/2021), Disp: , Rfl:    baclofen (LIORESAL) 10 MG tablet, Take 1 tablet (10 mg total) by mouth 2 (two) times daily. (Patient not taking: Reported on 11/04/2021), Disp: 10 each, Rfl: 0   cyclobenzaprine (FLEXERIL) 10 MG tablet, Take 1 tablet (10 mg total) by mouth 3 (three) times daily as needed for muscle spasms., Disp: 30 tablet, Rfl: 0   ibuprofen (ADVIL) 800 MG  tablet, Take 1 tablet (800 mg total) by mouth every 8 (eight) hours as needed. (Patient not taking: Reported on 11/04/2021), Disp: 30 tablet, Rfl: 0  Observations/Objective: Patient is well-developed, well-nourished in no acute distress.  Resting comfortably  at home.  Head is normocephalic, atraumatic.  No labored breathing.  Speech is clear and coherent with logical content.  Patient is alert and oriented at baseline.    Assessment and  Plan: 1. Vaginosis  - Flagyl '500mg'$  BID x 14 days.  Follow Up Instructions: I discussed the assessment and treatment plan with the patient. The patient was provided an opportunity to ask questions and all were answered. The patient agreed with the plan and demonstrated an understanding of the instructions.  A copy of instructions were sent to the patient via MyChart unless otherwise noted below.     The patient was advised to call back or seek an in-person evaluation if the symptoms worsen or if the condition fails to improve as anticipated.  Time:  I spent 10 minutes with the patient via telehealth technology discussing the above problems/concerns.    Montine Circle, PA-C

## 2022-02-02 ENCOUNTER — Encounter (HOSPITAL_COMMUNITY): Payer: Self-pay

## 2022-02-02 ENCOUNTER — Ambulatory Visit (HOSPITAL_COMMUNITY)
Admission: EM | Admit: 2022-02-02 | Discharge: 2022-02-02 | Disposition: A | Discharge status: Home / Self Care | Payer: Medicaid Other

## 2022-02-02 DIAGNOSIS — M79644 Pain in right finger(s): Secondary | ICD-10-CM | POA: Diagnosis not present

## 2022-02-05 ENCOUNTER — Ambulatory Visit: Payer: Medicaid Other | Admitting: Nurse Practitioner

## 2022-03-13 ENCOUNTER — Telehealth: Payer: Medicaid Other | Admitting: Nurse Practitioner

## 2022-03-13 DIAGNOSIS — N946 Dysmenorrhea, unspecified: Secondary | ICD-10-CM | POA: Diagnosis not present

## 2022-03-13 MED ORDER — CYCLOBENZAPRINE HCL 10 MG PO TABS: 10.0000 mg | ORAL_TABLET | Freq: Three times a day (TID) | ORAL | 0 refills | Status: DC | PRN

## 2022-03-13 NOTE — Progress Notes (Signed)
Virtual Visit Consent   Diana Robinson, you are scheduled for a virtual visit with Mary-Margaret Hassell Done, [place], a [facility] provider, today.     Just as with appointments in the office, your consent must be obtained to participate.  Your consent will be active for this visit and any virtual visit you may have with one of our providers in the next 365 days.     If you have a MyChart account, a copy of this consent can be sent to you electronically.  All virtual visits are billed to your insurance company just like a traditional visit in the office.    As this is a virtual visit, video technology does not allow for your provider to perform a traditional examination.  This may limit your provider's ability to fully assess your condition.  If your provider identifies any concerns that need to be evaluated in person or the need to arrange testing (such as labs, EKG, etc.), we will make arrangements to do so.     Although advances in technology are sophisticated, we cannot ensure that it will always work on either your end or our end.  If the connection with a video visit is poor, the visit may have to be switched to a telephone visit.  With either a video or telephone visit, we are not always able to ensure that we have a secure connection.     I need to obtain your verbal consent now.   Are you willing to proceed with your visit today? YES   Diana Robinson has provided verbal consent on 03/13/2022 for a virtual visit (video or telephone).   Mary-Margaret Hassell Done, FNP   Date: 03/13/2022 9:30 AM   Virtual Visit via Video Note   I, Mary-Margaret Hassell Done, connected with Diana Robinson ([id number], August 28, 2000) on 03/13/22 at  9:30 AM EDT by a video-enabled telemedicine application and verified that I am speaking with the correct person using two identifiers.  Location: Patient: Virtual Visit Location Patient: Home Provider: Virtual Visit Location Provider: Mobile   I discussed the limitations of evaluation  and management by telemedicine and the availability of in person appointments. The patient expressed understanding and agreed to proceed.    History of Present Illness: Diana Robinson is a 21 y.o. who identifies as a female who was assigned female at birth, and is being seen today for menustral cramps.  HPI: Patient c/o menustral cramps. This is normal for her. LMP started 03/11/22. She was prescribed flexeril in the past which really helps but she is out of meds. Rates cramps 9/10 currently. Standing and walking increase pain and lying down decreases pain some.    Review of Systems  Constitutional:  Negative for diaphoresis and weight loss.  Eyes:  Negative for blurred vision, double vision and pain.  Respiratory:  Negative for shortness of breath.   Cardiovascular:  Negative for chest pain, palpitations, orthopnea and leg swelling.  Gastrointestinal:  Negative for abdominal pain.  Skin:  Negative for rash.  Neurological:  Negative for dizziness, sensory change, loss of consciousness, weakness and headaches.  Endo/Heme/Allergies:  Negative for polydipsia. Does not bruise/bleed easily.  Psychiatric/Behavioral:  Negative for memory loss. The patient does not have insomnia.   All other systems reviewed and are negative.   Problems:  Patient Active Problem List   Diagnosis Date Noted   Elevated BP without diagnosis of hypertension 05/26/2019   Frequent headaches 05/26/2019    Allergies: Not on File Medications:  Current Outpatient Medications:  acetaminophen (TYLENOL) 325 MG tablet, Take 650 mg by mouth every 6 (six) hours as needed for moderate pain. (Patient not taking: Reported on 06/29/2021), Disp: , Rfl:    baclofen (LIORESAL) 10 MG tablet, Take 1 tablet (10 mg total) by mouth 2 (two) times daily. (Patient not taking: Reported on 11/04/2021), Disp: 10 each, Rfl: 0   cyclobenzaprine (FLEXERIL) 10 MG tablet, Take 1 tablet (10 mg total) by mouth 3 (three) times daily as needed for muscle  spasms., Disp: 30 tablet, Rfl: 0   ibuprofen (ADVIL) 800 MG tablet, Take 1 tablet (800 mg total) by mouth every 8 (eight) hours as needed. (Patient not taking: Reported on 11/04/2021), Disp: 30 tablet, Rfl: 0   metroNIDAZOLE (FLAGYL) 500 MG tablet, Take 1 tablet (500 mg total) by mouth 2 (two) times daily., Disp: 14 tablet, Rfl: 0  Observations/Objective: Patient is well-developed, well-nourished in no acute distress.  Resting comfortably  at home.  Head is normocephalic, atraumatic.  No labored breathing.  Speech is clear and coherent with logical content.  Patient is alert and oriented at baseline.    Assessment and Plan:  Diana Robinson in today with chief complaint of Dysmenorrhea   1. Menstrual cramps Moist heat   Rest RTO prn - cyclobenzaprine (FLEXERIL) 10 MG tablet; Take 1 tablet (10 mg total) by mouth 3 (three) times daily as needed for muscle spasms.  Dispense: 30 tablet; Refill: 0    Follow Up Instructions: I discussed the assessment and treatment plan with the patient. The patient was provided an opportunity to ask questions and all were answered. The patient agreed with the plan and demonstrated an understanding of the instructions.  A copy of instructions were sent to the patient via MyChart.  The patient was advised to call back or seek an in-person evaluation if the symptoms worsen or if the condition fails to improve as anticipated.  Time:  I spent 5 minutes with the patient via telehealth technology discussing the above problems/concerns.    Mary-Margaret Hassell Done, FNP

## 2022-03-13 NOTE — Patient Instructions (Signed)
Dysmnorrhe Dysmenorrhea La dysmnorrhe se caractrise par des crampes qui se produisent lorsque les muscles de l'utrus se resserrent (se contractent) en priode de rgles. La dysmnorrhe peut tre lgre ou suffisamment grave pour [employer] activits quotidiennes pendant quelques jours, [place]. La dysmnorrhe primaire se manifeste par des [employer]ation aux rgles qui surviennent au moment de l'arrive de celles-ci ou juste aprs, et qui durent quelques jours. En gnral, les crampes deviennent moins douloureuses et [employer] mme disparatre au fil du temps ou avec la survenue [place]. La dysmnorrhe secondaire survient plus tard [employer] et est engendre par un trouble du systme de reproduction. Elle dure plus longtemps et [employer] causer plus de douleur que la dysmnorrhe primaire. La douleur peut commencer avant les rgles et perdurer quelques jours aprs la fin de celles-ci. Quelles sont les causes ? La dysmnorrhe reflte habituellement un problme sous-jacent, comme : Une endomtriose. Le tissu qui constitue la paroi de l'utrus (endomtre) se dveloppe hors de celui-ci, Data processing manager. Une adnomyose. Le tissu de l'endomtre se dveloppe sur les parois musculaires de l'utrus. Un syndrome de congestion pelvienne. Les vaisseaux sanguins du bassin se remplissent de sang juste avant les rgles. Des excroissances de cellules (polypes) sont prsentes dans l'endomtre ou la partie infrieure de l'utrus (col de l'utrus). Un prolapsus utrin. L'utrus descend Lennar Corporation vagin en raison d'une faiblesse ou d'un tirement des muscles. Des problmes de vessie, par exemple une infection ou une inflammation. Des problmes intestinaux, tels qu'une tumeur ou le syndrome du clon irritable. Un cancer des Nordstrom reproducteurs ou de la vessie. Les autres causes de cette affection peuvent tre dues  : Un utrus rtrovers. Un col de l'utrus ferm ou ayant une ouverture  troite. La prsence de tumeurs non cancreuses (bnignes) dans l'utrus (fibromes). Une maladie inflammatoire pelvienne (MIP). Une cicatrisation pelvienne (adhrences) aprs une Best boy. Un kyste ovarien. Un DIU (dispositif intra-utrin). Quels sont les facteurs qui augmentent le risque ? Votre risque de dvelopper cette affection est plus lev si : Vous avez moins de 30 ans. Votre pubert a t prcoce. Vos saignements sont irrguliers ou abondants. Vous n'avez jamais accouch. Vous avez des antcdents familiaux de dysmnorrhe. Vous fumez ou [employer]  base de nicotine. Votre poids est trop lev ou trop [place]. Quels sont les signes ou symptmes ? Les symptmes de cette affection incluent : Des crampes, Landscape architect bas-ventre ou au bas du [place], ou bien une sensation de lourdeur dans le bas-ventre. Des rgles qui durent plus de 7 jours. Des maux de tte. Des ballonnements. De la fatigue. Des nauses ou des vomissements. Des diarrhes ou des selles non moules. Une transpiration ou des tourdissements. Comment se fait le diagnostic ? Le diagnostic de cette affection peut reposer sur : Les symptmes. Les antcdents mdicaux. Un examen clinique. Des analyses de sang. Un frottis vaginal. Il s'agit d'un test dans lequel les cellules du col de l'utrus sont examines pour Nordstrom signes de cancer ou d'infection. Un test de grossesse. Vous pourrez galement devoir effectuer d'autres examens, notamment : Des examens d'imagerie tels que : Une chographie. Une procdure permettant de prlever et d'examiner un chantillon de tissu endomtrial (dilatation et curetage, D&C). Une procdure permettant d'examiner visuellement l'intrieur de : L'utrus (hystroscopie). L'abdomen ou le bassin (laparoscopie). La vessie (cystoscopie). Des radiographies. Une tomodensitomtrie. Une IRM. Comment cette affection est-elle traite ? Le  traitement dpend de la cause de la dysmnorrhe. Le traitement pourra consister  prendre  des [employer], par exemple : Des mdicaments antalgiques. Un traitement hormonal substitutif. Une injection de progestrone pour arrter [employer]. Des pilules contraceptives qui contiennent l'hormone progestrone. Un DIU qui contient l'hormone progestrone. Des anti-inflammatoires non strodiens (AINS), comme l'ibuprofne. Ceux-ci peuvent aider  arrter la production d'hormones qui provoquent les crampes. Des antidpresseurs. Les autres traitements pourront notamment tre : Event organiser pour procder  l'ablation d'adhrences, d'une endomtriose, d'un kyste ovarien ou d'un fibrome, ou encore de Risk analyst (hystrectomie). Une ablation de l'endomtre. Il s'agit d'une intervention qui consiste  dtruire l'endomtre. Une neurectomie prsacre. Il s'agit d'une intervention qui consiste  couper les nerfs situs en bas de la colonne vertbrale ( sacrum) et Microsoft de reproduction. Une stimulation des [employer] Corporation. Il s'agit d'une procdure qui consiste  envoyer un courant lectrique aux nerfs du sacrum. Des exercices et une physiothrapie. La mditation, le yoga et l'acupuncture. Avec l'aide de Landscape architect de soins de sant, dterminez Marine scientist ou la combinaison de traitements qui rpond le mieux  vos besoins. Sharon les instructions suivantes  domicile : Comment Editor, commissioning et les crampes  Si cela vous a t recommand, appliquez de Event organiser au bas de [employer] ou sur votre abdomen lorsque vous avez des douleurs ou des crampes. Utilisez la source de Conservation officer, nature vous recommande votre prestataire de soins de sant, par exemple une compresse  chaleur humide ou un coussin chauffant. Placez une serviette entre votre peau et la source de Librarian, academic. Laissez la source de chaleur en place pendant 20  30 minutes. Retirez la source de chaleur si votre peau  devient rouge vif. Cela est particulirement important si vous ne ressentez Management consultant, la chaleur ou le froid. Vous risqueriez de vous brler. Ne dormez pas [employer] Energy. Faites de l'exercice physique. Des BJ's marche, la natation ou le vlo peuvent aider  soulager les crampes. Massez-vous le bas V[employer]om ou l'abdomen pour Manufacturing engineer. Instructions gnrales Prenez vos mdicaments en vente libre et sur ordonnance en suivant scrupuleusement les instructions de votre prestataire de soins de sant. Demandez  votre prestataire de soins de sant si vous pouvez conduire ou utiliser des machines lorsque vous prenez le mdicament qui vous a t prescrit. vitez l'alcool et la cafine pendant et juste avant vos rgles. Ils peuvent aggraver les crampes. N'utilisez pas de produits contenant de la nicotine ou du tabac. Il s'agit notamment des cigarettes, du tabac  mcher et des vapoteuses, comme les cigarettes lectroniques. Si vous avez besoin d'aide pour arrter de fumer, demandez conseil  votre prestataire de soins de sant. Rendez-vous  toutes vos visites de suivi. C'est important. Prenez Special educational needs teacher de soins de sant si : Vous ressentez [employer] empire ou qui n'est pas soulage par les mdicaments. Vous ressentez des [employer]Nation rapports sexuels. Vous souffrez de nauses ou de vomissements lors de vos rgles et les mdicaments sont sans effet. Demandez immdiatement de l'aide si : Vous vous vanouissez. Rsum La dysmnorrhe se caractrise par des [employer]Warner se produisent lorsque les muscles de l'utrus se resserrent (se contractent) en priode de rgles. La dysmnorrhe peut tre lgre ou suffisamment grave pour [employer]ys [employer] Us activits quotidiennes pendant quelques jours, [place]. Le traitement dpend de la cause de la dysmnorrhe. Avec l'aide de Landscape architect de soins de sant, dterminez Marine scientist ou la  combinaison de traitements qui rpond le mieux  vos besoins. Ces conseils et renseignements ne  sauraient se substituer  l'avis mdical de votre prestataire de soins de sant. Par consquent, il est primordial de parler de toutes vos proccupations avec votre prestataire de soins de sant. Document Revised: 05/19/2020 Document Reviewed: 05/19/2020 Elsevier Patient Education  [place].

## 2022-04-06 ENCOUNTER — Other Ambulatory Visit: Payer: Self-pay

## 2022-04-06 ENCOUNTER — Encounter (HOSPITAL_COMMUNITY): Payer: Self-pay

## 2022-04-06 ENCOUNTER — Emergency Department (HOSPITAL_COMMUNITY)
Admission: EM | Admit: 2022-04-06 | Discharge: 2022-04-06 | Disposition: A | Discharge status: Home / Self Care | Payer: Medicaid Other | Attending: Emergency Medicine | Admitting: Emergency Medicine

## 2022-04-06 DIAGNOSIS — R1030 Lower abdominal pain, unspecified: Secondary | ICD-10-CM | POA: Insufficient documentation

## 2022-04-06 DIAGNOSIS — Z5321 Procedure and treatment not carried out due to patient leaving prior to being seen by health care provider: Secondary | ICD-10-CM | POA: Insufficient documentation

## 2022-04-06 LAB — BASIC METABOLIC PANEL
Anion gap: 5 (ref 5–15)
BUN: 13 mg/dL (ref 6–20)
CO2: 24 mmol/L (ref 22–32)
Calcium: 9.2 mg/dL (ref 8.9–10.3)
Chloride: 108 mmol/L (ref 98–111)
Creatinine, Ser: 0.78 mg/dL (ref 0.44–1.00)
GFR, Estimated: >60 mL/min (ref >60)
Glucose, Bld: 108 mg/dL — ABNORMAL HIGH (ref 70–99)
Potassium: 4.2 mmol/L (ref 3.5–5.1)
Sodium: 137 mmol/L (ref 135–145)

## 2022-04-06 LAB — CBC
HCT: 41.1 % (ref 36.0–46.0)
Hemoglobin: 13.3 g/dL (ref 12.0–15.0)
MCH: 27.4 pg (ref 26.0–34.0)
MCHC: 32.4 g/dL (ref 30.0–36.0)
MCV: 84.7 fL (ref 80.0–100.0)
Platelets: 246 10*3/uL (ref 150–400)
RBC: 4.85 MIL/uL (ref 3.87–5.11)
RDW: 13.1 % (ref 11.5–15.5)
WBC: 5.4 10*3/uL (ref 4.0–10.5)
nRBC: 0 % (ref 0.0–0.2)

## 2022-04-06 LAB — I-STAT BETA HCG BLOOD, ED (MC, WL, AP ONLY): I-stat hCG, quantitative: <5 m[IU]/mL (ref <5)

## 2022-04-06 MED ORDER — ACETAMINOPHEN 500 MG PO TABS
1000.0000 mg | ORAL_TABLET | Freq: Once | ORAL | Status: DC
  Filled 2022-04-06: qty 2

## 2022-04-06 NOTE — ED Provider Triage Note (Signed)
Emergency Medicine Provider Triage Evaluation Note  Audris Speaker , a 21 y.o. female  was evaluated in triage.  Pt complains of lower abdominal cramping since 5 AM this morning.  Took Flexeril without relief.  States this is worse than her typical abdominal cramping with menstrual cycles.  Menstrual cycle started today..  Review of Systems  Positive: As above Negative: As above  Physical Exam  BP (!) 141/95 (BP Location: Left Arm)   Pulse 80   Temp 98.7 F (37.1 C) (Oral)   Resp 16   SpO2 97%  Gen:   Awake, no distress   Resp:  Normal effort MSK:   Moves extremities without difficulty  Other:    Medical Decision Making  Medically screening exam initiated at 12:09 PM.  Appropriate orders placed.  Sirenia Whitis was informed that the remainder of the evaluation will be completed by another provider, this initial triage assessment does not replace that evaluation, and the importance of remaining in the ED until their evaluation is complete.     Evlyn Courier, PA-C 04/06/22 1210

## 2022-04-06 NOTE — ED Triage Notes (Signed)
Pt reports she started her period today and endorses severe abdominal cramping and nausea. Pt reports hx of severe cramping with period, but states this is much worse than normal.

## 2022-04-06 NOTE — ED Notes (Signed)
I called patient name for vitals and no one responded.  I made nurse aware

## 2022-04-20 ENCOUNTER — Encounter (HOSPITAL_COMMUNITY): Payer: Self-pay | Admitting: *Deleted

## 2022-04-20 ENCOUNTER — Ambulatory Visit (HOSPITAL_COMMUNITY)
Admission: EM | Admit: 2022-04-20 | Discharge: 2022-04-20 | Disposition: A | Discharge status: Home / Self Care | Payer: Medicaid Other | Attending: Family Medicine | Admitting: Family Medicine

## 2022-04-20 DIAGNOSIS — R7309 Other abnormal glucose: Secondary | ICD-10-CM | POA: Diagnosis present

## 2022-04-20 DIAGNOSIS — R110 Nausea: Secondary | ICD-10-CM | POA: Insufficient documentation

## 2022-04-20 LAB — HEMOGLOBIN A1C
Hgb A1c MFr Bld: 5.5 % (ref 4.8–5.6)
Mean Plasma Glucose: 111.15 mg/dL

## 2022-04-20 NOTE — Discharge Instructions (Addendum)
We have done a hemoglobin A1c today.  Please follow-up with your primary care office about this lab work and your nausea also.   Please stop using someone else's glucometer and strips to check your sugar.

## 2022-04-20 NOTE — ED Triage Notes (Signed)
Pt states that her blood sugar has been up and down her and her boyfriend is checking her blood sugar at home. She is having some nausea and states that she doesn't have diabetes but her boyfriend think she is prediabetic since she has these symptoms. She states that her BS was high last time she went to ED. She has had these sx x 1 month.

## 2022-04-20 NOTE — ED Provider Notes (Signed)
Diana Robinson    CSN: [id number] Arrival date & time: 04/20/22  1916      History   Chief Complaint Chief Complaint  Patient presents with   Nausea    HPI Diana Robinson is a 21 y.o. female.   HPI Here for elevated blood sugar  On August 29 she had gone to the emergency room for a painful period and feeling nauseated.  She left without being seen but then saw a later that her sugar was elevated at 108.  She also and had a sugar of 113 in March.  Of note, her i-STAT pregnancy test on August 29 was negative.  She has since that ER visit, been checking her sugar with her boyfriend's meter.  She has gotten as high as 141, and she states she was fasting then.    Past Medical History:  Diagnosis Date   Migraine 05/2019   Vitamin D deficiency 10/2020    Patient Active Problem List   Diagnosis Date Noted   Elevated BP without diagnosis of hypertension 05/26/2019   Frequent headaches 05/26/2019    History reviewed. No pertinent surgical history.  OB History     Gravida  0   Para  0   Term  0   Preterm  0   AB  0   Living  0      SAB  0   IAB  0   Ectopic  0   Multiple  0   Live Births  0            Home Medications    Prior to Admission medications   Medication Sig Start Date End Date Taking? Authorizing Provider  baclofen (LIORESAL) 10 MG tablet Take 1 tablet (10 mg total) by mouth 2 (two) times daily. Patient not taking: Reported on 11/04/2021 06/04/21   Raspet, Derry Skill, PA-C  cyclobenzaprine (FLEXERIL) 10 MG tablet Take 1 tablet (10 mg total) by mouth 3 (three) times daily as needed for muscle spasms. 03/13/22   Chevis Pretty, FNP    Family History Family History  Problem Relation Age of Onset   Healthy Mother    Healthy Father     Social History Social History   Tobacco Use   Smoking status: Never   Smokeless tobacco: Never  Vaping Use   Vaping Use: Never used  Substance Use Topics   Alcohol use: Never   Drug  use: Never     Allergies   Patient has no known allergies.   Review of Systems Review of Systems   Physical Exam Triage Vital Signs ED Triage Vitals  Enc Vitals Group     BP 04/20/22 1959 (!) 147/97     Pulse Rate 04/20/22 1959 82     Resp 04/20/22 1959 18     Temp 04/20/22 1959 98.3 F (36.8 C)     Temp Source 04/20/22 1959 Oral     SpO2 04/20/22 1959 98 %     Weight --      Height --      Head Circumference --      Peak Flow --      Pain Score 04/20/22 1957 0     Pain Loc --      Pain Edu? --      Excl. in [place]? --    No data found.  Updated Vital Signs BP (!) 147/97 (BP Location: Left Arm)   Pulse 82   Temp 98.3 F (36.8 C) (Oral)  Resp 18   LMP 04/10/2022 (Approximate)   SpO2 98%   Visual Acuity Right Eye Distance:   Left Eye Distance:   Bilateral Distance:    Right Eye Near:   Left Eye Near:    Bilateral Near:     Physical Exam Vitals reviewed.  Constitutional:      General: She is not in acute distress.    Appearance: She is not ill-appearing, toxic-appearing or diaphoretic.  HENT:     Mouth/Throat:     Mouth: Mucous membranes are moist.  Eyes:     Extraocular Movements: Extraocular movements intact.     Pupils: Pupils are equal, round, and reactive to light.  Cardiovascular:     Rate and Rhythm: Normal rate and regular rhythm.  Pulmonary:     Effort: Pulmonary effort is normal.     Breath sounds: Normal breath sounds.  Skin:    Coloration: Skin is not jaundiced or pale.  Neurological:     Mental Status: She is alert and oriented to person, place, and time.  Psychiatric:        Behavior: Behavior normal.      UC Treatments / Results  Labs (all labs ordered are listed, but only abnormal results are displayed) Labs Reviewed  HEMOGLOBIN A1C    EKG   Radiology No results found.  Procedures Procedures (including critical care time)  Medications Ordered in UC Medications - No data to display  Initial Impression /  Assessment and Plan / UC Course  I have reviewed the triage vital signs and the nursing notes.  Pertinent labs & imaging results that were available during my care of the patient were reviewed by me and considered in my medical decision making (see chart for details).        We will do an A1c, and I discussed with her some of the parameters for diagnosing diabetes or prediabetes.  She does have a primary care, and I have asked her to please follow-up with them, as that is the more appropriate place to diagnose whether or not she has diabetes or prediabetes.  Also I have asked him to please stop checking her sugar with her boyfriend's meter. Final Clinical Impressions(s) / UC Diagnoses   Final diagnoses:  Nausea  Elevated glucose     Discharge Instructions      We have done a hemoglobin A1c today.  Please follow-up with your primary care office about this lab work and your nausea also.   Please stop using someone else's glucometer and strips to check your sugar.     ED Prescriptions   None    PDMP not reviewed this encounter.   Barrett Henle, MD 04/20/22 2010

## 2022-04-23 ENCOUNTER — Encounter (HOSPITAL_COMMUNITY): Payer: Self-pay | Admitting: Emergency Medicine

## 2022-04-23 ENCOUNTER — Emergency Department (HOSPITAL_COMMUNITY)
Admission: EM | Admit: 2022-04-23 | Discharge: 2022-04-24 | Discharge status: Against Medical Advice | Payer: Medicaid Other | Attending: Emergency Medicine | Admitting: Emergency Medicine

## 2022-04-23 ENCOUNTER — Emergency Department (HOSPITAL_COMMUNITY): Payer: Medicaid Other

## 2022-04-23 DIAGNOSIS — M25521 Pain in right elbow: Secondary | ICD-10-CM | POA: Diagnosis not present

## 2022-04-23 DIAGNOSIS — S00501A Unspecified superficial injury of lip, initial encounter: Secondary | ICD-10-CM | POA: Diagnosis present

## 2022-04-23 DIAGNOSIS — Z5321 Procedure and treatment not carried out due to patient leaving prior to being seen by health care provider: Secondary | ICD-10-CM | POA: Diagnosis not present

## 2022-04-23 DIAGNOSIS — S01511A Laceration without foreign body of lip, initial encounter: Secondary | ICD-10-CM | POA: Diagnosis not present

## 2022-04-23 DIAGNOSIS — W11XXXA Fall on and from ladder, initial encounter: Secondary | ICD-10-CM | POA: Insufficient documentation

## 2022-04-23 NOTE — ED Triage Notes (Addendum)
Patient reports fall off 8 step ladder. Reports hitting face on carpet. States tooth went into bottom lip. Also c/o R elbow pain. Denies neck and back pain. Denies taking a blood thinner.

## 2022-04-23 NOTE — ED Provider Triage Note (Signed)
Emergency Medicine Provider Triage Evaluation Note  Diana Robinson , a 21 y.o. female  was evaluated in triage.  Pt complains of fall from a ladder earlier this evening.  She fell onto a carpeted area.  Denies loss of consciousness.  Denies anticoagulation.  She did fall face first onto the carpeted floor.  Denies pain with EOMs.  She does have a lip laceration.  Reports right elbow pain.  Review of Systems  Positive: As above Negative: As above  Physical Exam  BP (!) 163/108   Pulse (!) 104   Temp 99.1 F (37.3 C)   Resp 18   LMP 04/10/2022 (Approximate)   SpO2 98%  Gen:   Awake, no distress   Resp:  Normal effort  MSK:   Moves extremities without difficulty  Other:    Medical Decision Making  Medically screening exam initiated at 9:21 PM.  Appropriate orders placed.  Diana Robinson was informed that the remainder of the evaluation will be completed by another provider, this initial triage assessment does not replace that evaluation, and the importance of remaining in the ED until their evaluation is complete.     Diana Courier, PA-C 04/23/22 2122

## 2022-05-10 ENCOUNTER — Encounter (HOSPITAL_COMMUNITY): Payer: Self-pay

## 2022-05-10 ENCOUNTER — Emergency Department (HOSPITAL_COMMUNITY)
Admission: EM | Admit: 2022-05-10 | Discharge: 2022-05-11 | Discharge status: Against Medical Advice | Payer: Medicaid Other | Attending: Student | Admitting: Student

## 2022-05-10 ENCOUNTER — Encounter: Payer: Self-pay | Admitting: Nurse Practitioner

## 2022-05-10 ENCOUNTER — Encounter (INDEPENDENT_AMBULATORY_CARE_PROVIDER_SITE_OTHER): Payer: Medicaid Other | Admitting: Nurse Practitioner

## 2022-05-10 ENCOUNTER — Ambulatory Visit (INDEPENDENT_AMBULATORY_CARE_PROVIDER_SITE_OTHER): Payer: Medicaid Other | Admitting: Nurse Practitioner

## 2022-05-10 VITALS — BP 161/133 | HR 118 | Temp 98.2°F | Ht 66.0 in | Wt 159.1 lb

## 2022-05-10 DIAGNOSIS — R7309 Other abnormal glucose: Secondary | ICD-10-CM

## 2022-05-10 DIAGNOSIS — Z5321 Procedure and treatment not carried out due to patient leaving prior to being seen by health care provider: Secondary | ICD-10-CM | POA: Diagnosis not present

## 2022-05-10 DIAGNOSIS — Z3201 Encounter for pregnancy test, result positive: Secondary | ICD-10-CM

## 2022-05-10 DIAGNOSIS — I10 Essential (primary) hypertension: Secondary | ICD-10-CM

## 2022-05-10 DIAGNOSIS — R030 Elevated blood-pressure reading, without diagnosis of hypertension: Secondary | ICD-10-CM | POA: Insufficient documentation

## 2022-05-10 LAB — COMPREHENSIVE METABOLIC PANEL
ALT: 12 U/L (ref 0–44)
AST: 15 U/L (ref 15–41)
Albumin: 3.9 g/dL (ref 3.5–5.0)
Alkaline Phosphatase: 78 U/L (ref 38–126)
Anion gap: 5 (ref 5–15)
BUN: 12 mg/dL (ref 6–20)
CO2: 23 mmol/L (ref 22–32)
Calcium: 9.2 mg/dL (ref 8.9–10.3)
Chloride: 105 mmol/L (ref 98–111)
Creatinine, Ser: 1.01 mg/dL — ABNORMAL HIGH (ref 0.44–1.00)
GFR, Estimated: >60 mL/min (ref >60)
Glucose, Bld: 93 mg/dL (ref 70–99)
Potassium: 3.8 mmol/L (ref 3.5–5.1)
Sodium: 133 mmol/L — ABNORMAL LOW (ref 135–145)
Total Bilirubin: 0.7 mg/dL (ref 0.3–1.2)
Total Protein: 7.9 g/dL (ref 6.5–8.1)

## 2022-05-10 LAB — CBC WITH DIFFERENTIAL/PLATELET
Abs Immature Granulocytes: 0.01 10*3/uL (ref 0.00–0.07)
Basophils Absolute: 0 10*3/uL (ref 0.0–0.1)
Basophils Relative: 1 %
Eosinophils Absolute: 0 10*3/uL (ref 0.0–0.5)
Eosinophils Relative: 1 %
HCT: 38.3 % (ref 36.0–46.0)
Hemoglobin: 12.6 g/dL (ref 12.0–15.0)
Immature Granulocytes: 0 %
Lymphocytes Relative: 35 %
Lymphs Abs: 1.7 10*3/uL (ref 0.7–4.0)
MCH: 27.6 pg (ref 26.0–34.0)
MCHC: 32.9 g/dL (ref 30.0–36.0)
MCV: 84 fL (ref 80.0–100.0)
Monocytes Absolute: 0.5 10*3/uL (ref 0.1–1.0)
Monocytes Relative: 10 %
Neutro Abs: 2.5 10*3/uL (ref 1.7–7.7)
Neutrophils Relative %: 53 %
Platelets: 258 10*3/uL (ref 150–400)
RBC: 4.56 MIL/uL (ref 3.87–5.11)
RDW: 13.4 % (ref 11.5–15.5)
WBC: 4.7 10*3/uL (ref 4.0–10.5)
nRBC: 0 % (ref 0.0–0.2)

## 2022-05-10 LAB — HCG, QUANTITATIVE, PREGNANCY: hCG, Beta Chain, Quant, S: 6385 m[IU]/mL — ABNORMAL HIGH (ref <5)

## 2022-05-10 NOTE — ED Provider Triage Note (Signed)
Emergency Medicine Provider Triage Evaluation Note  Diana Robinson , a 21 y.o. female  was evaluated in triage.  Pt complains of elevated blood pressure reading at her PCP for routine follow-up.  Patient is newly pregnant, last.  04/06/2022.  No previous pregnancies, she denies any abdominal pain, chest pain, shortness of breath, vaginal bleeding, vomiting, nausea, headaches, body aches, fevers, chills..  Review of Systems  Per HPI  Physical Exam  BP 134/87   Pulse 96   Temp 99.3 F (37.4 C) (Oral)   Resp 18   Ht '5\' 5"'$  (1.651 m)   Wt 72.1 kg   LMP 04/10/2022 (Approximate)   SpO2 97%   BMI 26.46 kg/m  Gen:   Awake, no distress   Resp:  Normal effort  MSK:   Moves extremities without difficulty  Other:    Medical Decision Making  Medically screening exam initiated at 1:02 PM.  Appropriate orders placed.  Alegra Rost was informed that the remainder of the evaluation will be completed by another provider, this initial triage assessment does not replace that evaluation, and the importance of remaining in the ED until their evaluation is complete.     Sherrill Raring, PA-C 05/10/22 1303

## 2022-05-10 NOTE — Progress Notes (Signed)
$'@Patient'B$  ID: Diana Robinson, female    DOB: [birth year], 21 y.o.   MRN: [id number]  Chief Complaint  Patient presents with   Hospitalization Follow-up    Pt is here for hospital follow up visit. Pt states she is concern with glucose levels also pt just found out last week that she is pregnant.    Referring provider: No ref. provider found   HPI  Patient presents today for UC follow up. She states that her pregnancy test was positive. Does need referral to OB/GYN. Blood pressure is elevated in office today - patient advised to go to the ED for further evaluation. She states that her boyfriend is with her and can transport her now. It was also noted that blood sugars have recently been elevated. Denies f/c/s, n/v/d, hemoptysis, PND, leg swelling Denies chest pain or edema     No Known Allergies   There is no immunization history on file for this patient.  Past Medical History:  Diagnosis Date   Migraine 05/2019   Vitamin D deficiency 10/2020    Tobacco History: Social History   Tobacco Use  Smoking Status Never  Smokeless Tobacco Never   Counseling given: Not Answered   Outpatient Encounter Medications as of 05/10/2022  Medication Sig   baclofen (LIORESAL) 10 MG tablet Take 1 tablet (10 mg total) by mouth 2 (two) times daily. (Patient not taking: Reported on 11/04/2021)   cyclobenzaprine (FLEXERIL) 10 MG tablet Take 1 tablet (10 mg total) by mouth 3 (three) times daily as needed for muscle spasms. (Patient not taking: Reported on 05/10/2022)   No facility-administered encounter medications on file as of 05/10/2022.     Review of Systems  Review of Systems  Constitutional: Negative.   HENT: Negative.    Cardiovascular: Negative.   Gastrointestinal: Negative.   Allergic/Immunologic: Negative.   Neurological: Negative.   Psychiatric/Behavioral: Negative.         Physical Exam  BP (!) 161/133 (BP Location: Left Arm, Patient Position: Sitting, Cuff Size: Normal)    Pulse (!) 118   Temp 98.2 F (36.8 C)   Ht '5\' 6"'$  (1.676 m)   Wt 159 lb 2 oz (72.2 kg)   LMP 04/10/2022 (Approximate)   SpO2 100%   BMI 25.68 kg/m   Wt Readings from Last 5 Encounters:  05/10/22 159 lb 2 oz (72.2 kg)  11/27/21 158 lb 15.2 oz (72.1 kg)  11/04/21 159 lb (72.1 kg)  09/10/21 140 lb (63.5 kg)  06/29/21 145 lb (65.8 kg)     Physical Exam Vitals and nursing note reviewed.  Constitutional:      General: She is not in acute distress.    Appearance: She is well-developed.  Cardiovascular:     Rate and Rhythm: Normal rate and regular rhythm.  Pulmonary:     Effort: Pulmonary effort is normal.     Breath sounds: Normal breath sounds.  Neurological:     Mental Status: She is alert and oriented to person, place, and time.      Lab Results:  CBC    Component Value Date/Time   WBC 5.4 04/06/2022 1240   RBC 4.85 04/06/2022 1240   HGB 13.3 04/06/2022 1240   HGB 11.3 11/04/2021 1123   HCT 41.1 04/06/2022 1240   HCT 35.6 11/04/2021 1123   PLT 246 04/06/2022 1240   PLT 229 11/04/2021 1123   MCV 84.7 04/06/2022 1240   MCV 83 11/04/2021 1123   MCH 27.4 04/06/2022 1240   MCHC 32.4 04/06/2022  1240   RDW 13.1 04/06/2022 1240   RDW 14.1 11/04/2021 1123   LYMPHSABS 2.0 11/04/2021 1123   MONOABS 0.4 09/10/2021 0953   EOSABS 0.1 11/04/2021 1123   BASOSABS 0.1 11/04/2021 1123    BMET    Component Value Date/Time   NA 137 04/06/2022 1240   NA 142 11/04/2021 1123   K 4.2 04/06/2022 1240   CL 108 04/06/2022 1240   CO2 24 04/06/2022 1240   GLUCOSE 108 (H) 04/06/2022 1240   BUN 13 04/06/2022 1240   BUN 12 11/04/2021 1123   CREATININE 0.78 04/06/2022 1240   CALCIUM 9.2 04/06/2022 1240   GFRNONAA >60 04/06/2022 1240   GFRAA 107 05/25/2019 1531    BNP No results found for: "BNP"  ProBNP No results found for: "PROBNP"  Imaging: DG Elbow Complete Right  Result Date: 04/23/2022 CLINICAL DATA:  Recent fall from step ladder, initial encounter EXAM: RIGHT  ELBOW - COMPLETE 3+ VIEW COMPARISON:  None Available. FINDINGS: There is no evidence of fracture, dislocation, or joint effusion. There is no evidence of arthropathy or other focal bone abnormality. Soft tissues are unremarkable. IMPRESSION: No acute abnormality noted. Electronically Signed   By: Inez Catalina M.D.   On: 04/23/2022 20:17     Assessment & Plan:   Positive pregnancy test - Ambulatory referral to Obstetrics / Gynecology  2. Primary hypertension  - Ambulatory referral to Obstetrics / Gynecology  Note: blood pressure elevated in office today - patient is pregnant- advised to go straight to ED with close follow up here in office  3. Elevated glucose  - Ambulatory referral to Obstetrics / Gynecology   Follow up:  Follow up in [place], NP 05/10/2022

## 2022-05-10 NOTE — Patient Instructions (Signed)
1. Positive pregnancy test  - Ambulatory referral to Obstetrics / Gynecology  2. Primary hypertension  - Ambulatory referral to Obstetrics / Gynecology  Note: blood pressure elevated in office today - patient is pregnant- advised to go straight to ED with close follow up here in office  3. Elevated glucose  - Ambulatory referral to Obstetrics / Gynecology   Follow up:  Follow up in 3

## 2022-05-10 NOTE — Assessment & Plan Note (Signed)
-   Ambulatory referral to Obstetrics / Gynecology  2. Primary hypertension  - Ambulatory referral to Obstetrics / Gynecology  Note: blood pressure elevated in office today - patient is pregnant- advised to go straight to ED with close follow up here in office  3. Elevated glucose  - Ambulatory referral to Obstetrics / Gynecology   Follow up:  Follow up in 3

## 2022-05-10 NOTE — ED Triage Notes (Signed)
Pt arrived via POV, c/o high BP. States coming from PCP office, has been told the last two visits, was told BP was high. No medications ordered.  Took pregnancy test that was positive.  LMP agust 29th

## 2022-05-12 NOTE — Progress Notes (Signed)
error 

## 2022-05-31 ENCOUNTER — Telehealth (HOSPITAL_BASED_OUTPATIENT_CLINIC_OR_DEPARTMENT_OTHER): Payer: Self-pay | Admitting: *Deleted

## 2022-05-31 ENCOUNTER — Other Ambulatory Visit (HOSPITAL_COMMUNITY)
Admission: RE | Admit: 2022-05-31 | Discharge: 2022-05-31 | Disposition: A | Discharge status: Home / Self Care | Payer: Medicaid Other | Source: Ambulatory Visit | Attending: Obstetrics & Gynecology | Admitting: Obstetrics & Gynecology

## 2022-05-31 ENCOUNTER — Ambulatory Visit (INDEPENDENT_AMBULATORY_CARE_PROVIDER_SITE_OTHER): Payer: Medicaid Other | Admitting: *Deleted

## 2022-05-31 ENCOUNTER — Encounter (HOSPITAL_BASED_OUTPATIENT_CLINIC_OR_DEPARTMENT_OTHER): Payer: Self-pay | Admitting: *Deleted

## 2022-05-31 VITALS — BP 141/94 | HR 89 | Wt 161.2 lb

## 2022-05-31 DIAGNOSIS — Z3401 Encounter for supervision of normal first pregnancy, first trimester: Secondary | ICD-10-CM | POA: Insufficient documentation

## 2022-05-31 DIAGNOSIS — O0990 Supervision of high risk pregnancy, unspecified, unspecified trimester: Secondary | ICD-10-CM | POA: Insufficient documentation

## 2022-05-31 DIAGNOSIS — Z3400 Encounter for supervision of normal first pregnancy, unspecified trimester: Secondary | ICD-10-CM | POA: Insufficient documentation

## 2022-05-31 LAB — HEPATITIS C ANTIBODY: HCV Ab: NEGATIVE

## 2022-05-31 MED ORDER — BLOOD PRESSURE KIT DEVI: 1.0000 | Freq: Once | 0 refills | Status: AC

## 2022-05-31 MED ORDER — PROMETHAZINE HCL 25 MG PO TABS: 25.0000 mg | ORAL_TABLET | Freq: Four times a day (QID) | ORAL | 0 refills | Status: DC | PRN

## 2022-05-31 NOTE — Telephone Encounter (Signed)
Called patient x2  and was unable to leave a message voice mail full.

## 2022-05-31 NOTE — Progress Notes (Signed)
New OB Intake  I explained I am completing New OB Intake today. We discussed EDD of 01/11/23 that is based on LMP of 04/06/22. Pt is G1/P0. I reviewed her allergies, medications, Medical/Surgical/OB history, and appropriate screenings. I informed her of [facility] services. [facility] information placed in AVS. Based on history, this is a low risk pregnancy.  Patient Active Problem List   Diagnosis Date Noted   Positive pregnancy test 05/10/2022   Elevated BP without diagnosis of hypertension 05/26/2019   Frequent headaches 05/26/2019    Concerns addressed today  Delivery Plans Plans to deliver at [facility]. Patient given information for [facility] Healthy Baby website for more information about Women's and [place]. Patient is not interested in water birth.   MyChart/Babyscripts MyChart access verified. I explained pt will have some visits in office and some virtually. Babyscripts instructions given and order placed.   Blood Pressure Cuff/Weight Scale Blood pressure cuff ordered for patient to pick-up from [employer]. Explained after first prenatal appt pt will check weekly and document in 17.   Labs Discussed Johnsie Cancel genetic screening with patient. Would like both Panorama and Horizon drawn at new OB visit. Routine prenatal labs needed.  Social Determinants of Health Food Insecurity: Patient denies food insecurity. WIC Referral: Patient is interested in referral to [facility].  Transportation: Patient denies transportation needs.  Blenda Nicely, RN 05/31/2022  1:17 PM

## 2022-06-01 LAB — ANTIBODY SCREEN: Antibody Screen: NEGATIVE

## 2022-06-01 LAB — URINE CULTURE

## 2022-06-01 LAB — ABO/RH: Rh Factor: POSITIVE

## 2022-06-01 LAB — CBC
Hematocrit: 38.1 % (ref 34.0–46.6)
Hemoglobin: 12.6 g/dL (ref 11.1–15.9)
MCH: 27 pg (ref 26.6–33.0)
MCHC: 33.1 g/dL (ref 31.5–35.7)
MCV: 82 fL (ref 79–97)
Platelets: 240 10*3/uL (ref 150–450)
RBC: 4.66 x10E6/uL (ref 3.77–5.28)
RDW: 13.8 % (ref 11.7–15.4)
WBC: 4.8 10*3/uL (ref 3.4–10.8)

## 2022-06-01 LAB — HIV ANTIBODY (ROUTINE TESTING W REFLEX): HIV Screen 4th Generation wRfx: NONREACTIVE

## 2022-06-01 LAB — HEPATITIS C ANTIBODY: Hep C Virus Ab: NONREACTIVE

## 2022-06-01 LAB — RPR: RPR Ser Ql: NONREACTIVE

## 2022-06-01 LAB — CERVICOVAGINAL ANCILLARY ONLY
Chlamydia: NEGATIVE
Comment: NEGATIVE
Comment: NORMAL
Neisseria Gonorrhea: NEGATIVE

## 2022-06-01 LAB — HEPATITIS B SURFACE ANTIGEN: Hepatitis B Surface Ag: NEGATIVE

## 2022-06-01 LAB — RUBELLA SCREEN: Rubella Antibodies, IGG: 32.3 index (ref >0.99)

## 2022-06-07 ENCOUNTER — Other Ambulatory Visit (HOSPITAL_BASED_OUTPATIENT_CLINIC_OR_DEPARTMENT_OTHER): Payer: Self-pay | Admitting: Obstetrics & Gynecology

## 2022-06-07 ENCOUNTER — Encounter (HOSPITAL_BASED_OUTPATIENT_CLINIC_OR_DEPARTMENT_OTHER): Payer: Self-pay | Admitting: *Deleted

## 2022-06-07 ENCOUNTER — Telehealth (HOSPITAL_BASED_OUTPATIENT_CLINIC_OR_DEPARTMENT_OTHER): Payer: Self-pay | Admitting: *Deleted

## 2022-06-07 DIAGNOSIS — I10 Essential (primary) hypertension: Secondary | ICD-10-CM

## 2022-06-07 MED ORDER — NIFEDIPINE ER OSMOTIC RELEASE 30 MG PO TB24: 30.0000 mg | ORAL_TABLET | Freq: Every day | ORAL | 0 refills | Status: DC

## 2022-06-07 NOTE — Telephone Encounter (Signed)
Attempted to call pt regarding babyscripts BP reading. Voicemail full. Will send myChart message.

## 2022-06-15 ENCOUNTER — Ambulatory Visit (HOSPITAL_BASED_OUTPATIENT_CLINIC_OR_DEPARTMENT_OTHER): Payer: Self-pay

## 2022-06-15 ENCOUNTER — Encounter (HOSPITAL_BASED_OUTPATIENT_CLINIC_OR_DEPARTMENT_OTHER): Payer: Self-pay | Admitting: Obstetrics & Gynecology

## 2022-06-15 ENCOUNTER — Other Ambulatory Visit (HOSPITAL_COMMUNITY)
Admission: RE | Admit: 2022-06-15 | Discharge: 2022-06-15 | Disposition: A | Discharge status: Home / Self Care | Payer: Medicaid Other | Source: Ambulatory Visit | Attending: Obstetrics & Gynecology | Admitting: Obstetrics & Gynecology

## 2022-06-15 ENCOUNTER — Ambulatory Visit (INDEPENDENT_AMBULATORY_CARE_PROVIDER_SITE_OTHER): Payer: Medicaid Other | Admitting: Obstetrics & Gynecology

## 2022-06-15 VITALS — BP 154/98 | HR 88 | Ht 66.0 in | Wt 160.6 lb

## 2022-06-15 DIAGNOSIS — O3680X0 Pregnancy with inconclusive fetal viability, not applicable or unspecified: Secondary | ICD-10-CM

## 2022-06-15 DIAGNOSIS — O0991 Supervision of high risk pregnancy, unspecified, first trimester: Secondary | ICD-10-CM

## 2022-06-15 DIAGNOSIS — O0990 Supervision of high risk pregnancy, unspecified, unspecified trimester: Secondary | ICD-10-CM

## 2022-06-15 DIAGNOSIS — Z3A10 10 weeks gestation of pregnancy: Secondary | ICD-10-CM

## 2022-06-15 DIAGNOSIS — O161 Unspecified maternal hypertension, first trimester: Secondary | ICD-10-CM

## 2022-06-15 DIAGNOSIS — I10 Essential (primary) hypertension: Secondary | ICD-10-CM | POA: Insufficient documentation

## 2022-06-15 DIAGNOSIS — R011 Cardiac murmur, unspecified: Secondary | ICD-10-CM

## 2022-06-15 DIAGNOSIS — O3680X1 Pregnancy with inconclusive fetal viability, fetus 1: Secondary | ICD-10-CM | POA: Diagnosis not present

## 2022-06-15 MED ORDER — ASPIRIN 81 MG PO CHEW: 81.0000 mg | CHEWABLE_TABLET | Freq: Every day | ORAL | 9 refills | Status: DC

## 2022-06-15 MED ORDER — PRENATAL VITAMIN 27-0.8 MG PO TABS: 1.0000 | ORAL_TABLET | Freq: Every day | ORAL | Status: DC

## 2022-06-15 MED ORDER — NIFEDIPINE ER OSMOTIC RELEASE 60 MG PO TB24: 60.0000 mg | ORAL_TABLET | Freq: Every day | ORAL | 1 refills | Status: DC

## 2022-06-15 NOTE — Progress Notes (Signed)
History:   Diana Robinson is a 21 y.o. G1P0000 at 37 0/7 by LMP being seen today for her first obstetrical visit.  Her obstetrical history is significant for  nulliparity . Patient does intend to breast feed. Pregnancy history fully reviewed.  Initial blood pressures with blood pressure cuff at home were elevated.  She has been started on Procardia.  She is taking this.  Pt aware BP still elevated today.  Patient reports  some nausea .      HISTORY: OB History  Gravida Para Term Preterm AB Living  1 0 0 0 0 0  SAB IAB Ectopic Multiple Live Births  0 0 0 0 0    # Outcome Date GA Lbr Len/2nd Weight Sex Delivery Anes PTL Lv  1 Current             Pt has never had a pap smear done.  Will obtain today.  Past Medical History:  Diagnosis Date   Migraine 05/2019   Vitamin D deficiency 10/2020   No past surgical history on file. Family History  Problem Relation Age of Onset   Healthy Mother    Healthy Father    Social History   Tobacco Use   Smoking status: Never   Smokeless tobacco: Never  Vaping Use   Vaping Use: Never used  Substance Use Topics   Alcohol use: Never   Drug use: Never   No Known Allergies Current Outpatient Medications on File Prior to Visit  Medication Sig Dispense Refill   promethazine (PHENERGAN) 25 MG tablet Take 1 tablet (25 mg total) by mouth every 6 (six) hours as needed for nausea or vomiting. 30 tablet 0   No current facility-administered medications on file prior to visit.    Review of Systems Pertinent items noted in HPI and remainder of comprehensive ROS otherwise negative.  Physical Exam:   Vitals:   06/15/22 1355  BP: (!) 154/98  Pulse: 88  Weight: 160 lb 9.6 oz (72.8 kg)     Viability ultrasound completed today.  FCA 181 BPM.  [place] IUP noted.  Single fibroid seen as well.  General: well-developed, well-nourished female in no acute distress  Breasts:  deferred  Skin: normal coloration and turgor, no rashes  Neurologic:  oriented, normal, negative, normal mood  Extremities: normal strength, tone, and muscle mass, ROM of all joints is normal  HEENT PERRLA, extraocular movement intact and sclera clear, anicteric  Neck supple and no masses  Cardiovascular: regular rate and rhythm, 3/6 systolic murmur noted  Respiratory:  no respiratory distress, normal breath sounds  Abdomen: soft, non-tender; bowel sounds normal; no masses,  no organomegaly  Pelvic: normal external genitalia, no lesions, normal vaginal mucosa, normal vaginal discharge, normal cervix, pap smear done. Uterine size: 10 weeks     Assessment:    Pregnancy: G1P0000 Patient Active Problem List   Diagnosis Date Noted   Primary hypertension 06/15/2022   Supervision of high risk pregnancy, antepartum 05/31/2022   Elevated BP without diagnosis of hypertension 05/26/2019   Frequent headaches 05/26/2019     Plan:    1. Supervision of high risk pregnancy in first trimester - [place] MFM OB DETAIL +14 WK; Future - Panorama Prenatal Test Full Panel - HORIZON Custom - Cytology - PAP( [place]) - Prenatal Vit-Fe Fumarate-FA (PRENATAL VITAMIN) 27-0.8 MG TABS; Take 1 tablet by mouth daily.  Dispense: 30 tablet  2. Encounter to determine fetal viability of pregnancy, single or unspecified fetus - documented separately  3. Hypertension affecting pregnancy in first trimester - Protein / creatinine ratio, urine - Comprehensive metabolic panel - NIFEdipine (PROCARDIA XL) 60 MG 24 hr tablet; Take 1 tablet (60 mg total) by mouth daily.  Dispense: 30 tablet; Refill: 1 - aspirin 81 MG chewable tablet; Chew 1 tablet (81 mg total) by mouth daily.  Dispense: 30 tablet; Refill: 9  4. Primary hypertension  5. Supervision of high risk pregnancy, antepartum - US OB Comp Less 14 Wks  6.  Systolic murmur - referral to ob cardiology placed today.  Pt has never had any cardiac evaluation and has never been told she has a murmur    Initial labs drawn. Continue  prenatal vitamins. Problem list reviewed and updated. Genetic Screening discussed, NIPS: ordered. Ultrasound discussed; fetal anatomic survey: requested. Anticipatory guidance about prenatal visits given including labs, ultrasounds, and testing. Discussed usage of Babyscripts and virtual visits as additional source of managing and completing prenatal visits in midst of coronavirus and pandemic.   Encouraged to complete MyChart Registration for her ability to review results, send requests, and have questions addressed.  The nature of [place] for [facility] with multiple MDs and Advanced Practice Providers was explained to patient; also emphasized that residents, students are part of our team. Routine obstetric precautions reviewed. Encouraged to seek out care at office or emergency room [facility] preferred) for urgent and/or emergent concerns. Return in about 4 weeks (around 07/13/2022).     Felipa Emory, MD, [place], [facility] for [facility], [place]

## 2022-06-16 LAB — COMPREHENSIVE METABOLIC PANEL
ALT: 9 IU/L (ref 0–32)
AST: 17 IU/L (ref 0–40)
Albumin/Globulin Ratio: 1.3 (ref 1.2–2.2)
Albumin: 4.3 g/dL (ref 4.0–5.0)
Alkaline Phosphatase: 82 IU/L (ref 44–121)
BUN/Creatinine Ratio: 16 (ref 9–23)
BUN: 13 mg/dL (ref 6–20)
Bilirubin Total: 0.2 mg/dL (ref 0.0–1.2)
CO2: 19 mmol/L — ABNORMAL LOW (ref 20–29)
Calcium: 9.9 mg/dL (ref 8.7–10.2)
Chloride: 102 mmol/L (ref 96–106)
Creatinine, Ser: 0.79 mg/dL (ref 0.57–1.00)
Globulin, Total: 3.2 g/dL (ref 1.5–4.5)
Glucose: 93 mg/dL (ref 70–99)
Potassium: 3.8 mmol/L (ref 3.5–5.2)
Sodium: 137 mmol/L (ref 134–144)
Total Protein: 7.5 g/dL (ref 6.0–8.5)
eGFR: 108 mL/min/{1.73_m2} (ref >59)

## 2022-06-16 LAB — PROTEIN / CREATININE RATIO, URINE
Creatinine, Urine: 429.1 mg/dL
Protein, Ur: 27.8 mg/dL
Protein/Creat Ratio: 65 mg/g creat (ref 0–200)

## 2022-06-18 LAB — CYTOLOGY - PAP

## 2022-06-20 LAB — PANORAMA PRENATAL TEST FULL PANEL:PANORAMA TEST PLUS 5 ADDITIONAL MICRODELETIONS: FETAL FRACTION: 7.2

## 2022-06-24 LAB — HORIZON CUSTOM: REPORT SUMMARY: POSITIVE — AB

## 2022-06-28 ENCOUNTER — Telehealth (HOSPITAL_BASED_OUTPATIENT_CLINIC_OR_DEPARTMENT_OTHER): Payer: Self-pay | Admitting: *Deleted

## 2022-06-28 NOTE — Telephone Encounter (Signed)
Called pt in response to elevated BP reading in Babyscripts. Pt denies symptoms. Is currently taking procardia as prescribed. Pt to come into office tomorrow for a BP check with nurse.

## 2022-06-29 ENCOUNTER — Ambulatory Visit (INDEPENDENT_AMBULATORY_CARE_PROVIDER_SITE_OTHER): Payer: Medicaid Other | Admitting: *Deleted

## 2022-06-29 VITALS — BP 138/94 | HR 82 | Ht 66.0 in | Wt 163.4 lb

## 2022-06-29 DIAGNOSIS — O0990 Supervision of high risk pregnancy, unspecified, unspecified trimester: Secondary | ICD-10-CM

## 2022-06-29 NOTE — Progress Notes (Signed)
Pt here for BP check. Had elevated BP entered yesterday in Babyscripts. Pt denies symptoms. +FHT. Pt just increased her Procardia to '60mg'$  yesterday. Was advised at last prenatal visit to increase from '30mg'$  to '60mg'$  daily. Advised to take medication as directed. Pt has appt with cardiology next week.

## 2022-07-05 ENCOUNTER — Ambulatory Visit: Payer: Medicaid Other | Attending: Cardiology | Admitting: Cardiology

## 2022-07-05 ENCOUNTER — Encounter: Payer: Self-pay | Admitting: Cardiology

## 2022-07-05 VITALS — BP 137/81 | HR 95 | Ht 66.0 in | Wt 161.0 lb

## 2022-07-05 DIAGNOSIS — Z7689 Persons encountering health services in other specified circumstances: Secondary | ICD-10-CM | POA: Diagnosis not present

## 2022-07-05 DIAGNOSIS — O10919 Unspecified pre-existing hypertension complicating pregnancy, unspecified trimester: Secondary | ICD-10-CM | POA: Insufficient documentation

## 2022-07-05 DIAGNOSIS — R011 Cardiac murmur, unspecified: Secondary | ICD-10-CM

## 2022-07-05 NOTE — Progress Notes (Signed)
Cardio-Obstetrics Clinic  New Evaluation  Date:  07/05/2022   ID:  Diana Robinson, DOB 01-21-2001, MRN [id number]  PCP:  Diana Foy, NP   [place] Providers Cardiologist:  Berniece Salines, DO  Electrophysiologist:  None       Referring MD: Megan Salon, MD   Chief Complaint:   History of Present Illness:    Diana Robinson is a 21 y.o. female [G1P0000] who is being seen today for the evaluation of murmur at the request of Megan Salon, MD.    Medical history includes hypertension which was diagnosed 3 years ago she is currently on nifedipine, and has been started on aspirin 81 mg for preeclampsia prophylaxis appropriately.  The patient is here with her significant other.  This is her first pregnancy.  She is [redacted] weeks pregnant.  She denies any chest pain or shortness of breath.  Prior CV Studies Reviewed: The following studies were reviewed today:   Past Medical History:  Diagnosis Date   Migraine 05/2019   Vitamin D deficiency 10/2020    No past surgical history on file.    OB History     Gravida  1   Para  0   Term  0   Preterm  0   AB  0   Living  0      SAB  0   IAB  0   Ectopic  0   Multiple  0   Live Births  0               Current Medications: Current Meds  Medication Sig   aspirin 81 MG chewable tablet Chew 1 tablet (81 mg total) by mouth daily.   NIFEdipine (PROCARDIA XL) 60 MG 24 hr tablet Take 1 tablet (60 mg total) by mouth daily.   Prenatal Vit-Fe Fumarate-FA (PRENATAL VITAMIN) 27-0.8 MG TABS Take 1 tablet by mouth daily.   promethazine (PHENERGAN) 25 MG tablet Take 1 tablet (25 mg total) by mouth every 6 (six) hours as needed for nausea or vomiting.     Allergies:   Patient has no known allergies.   Social History   Socioeconomic History   Marital status: Single    Spouse name: Not on file   Number of children: Not on file   Years of education: Not on file   Highest education level: Not on file   Occupational History   Not on file  Tobacco Use   Smoking status: Never   Smokeless tobacco: Never  Vaping Use   Vaping Use: Never used  Substance and Sexual Activity   Alcohol use: Never   Drug use: Never   Sexual activity: Yes    Birth control/protection: None  Other Topics Concern   Not on file  Social History Narrative   Not on file   Social Determinants of Health   Financial Resource Strain: Low Risk  (05/31/2022)   Overall Financial Resource Strain (CARDIA)    Difficulty of Paying Living Expenses: Not hard at all  Food Insecurity: No Food Insecurity (05/31/2022)   Hunger Vital Sign    Worried About Running Out of Food in the Last Year: Never true    [place] in the Last Year: Never true  Transportation Needs: No Transportation Needs (05/31/2022)   PRAPARE - Hydrologist (Medical): No    Lack of Transportation (Non-Medical): No  Physical Activity: Inactive (05/31/2022)   Exercise Vital Sign  Days of Exercise per Week: 0 days    Minutes of Exercise per Session: 0 min  Stress: No Stress Concern Present (05/31/2022)   [place]    Feeling of Stress : Not at all  Social Connections: Moderately Integrated (05/31/2022)   Social Connection and Isolation Panel [NHANES]    Frequency of Communication with Friends and Family: Twice a week    Frequency of Social Gatherings with Friends and Family: Once a week    Attends Religious Services: More than 4 times per year    Active Member of [employer] or Organizations: No    Attends Archivist Meetings: Never    Marital Status: Living with partner      Family History  Problem Relation Age of Onset   Healthy Mother    Healthy Father       ROS:   Please see the history of present illness.     All other systems reviewed and are negative.   Labs/EKG Reviewed:    EKG:   EKG is was ordered today.  The ekg ordered  today demonstrates sinus rhythm, heart rate 95 bpm.  Recent Labs: 05/31/2022: Hemoglobin 12.6; Platelets 240 06/15/2022: ALT 9; BUN 13; Creatinine, Ser 0.79; Potassium 3.8; Sodium 137   Recent Lipid Panel Lab Results  Component Value Date/Time   CHOL 158 11/04/2021 11:23 AM   TRIG 39 11/04/2021 11:23 AM   HDL 73 11/04/2021 11:23 AM   CHOLHDL 2.2 11/04/2021 11:23 AM   LDLCALC 76 11/04/2021 11:23 AM    Physical Exam:    VS:  BP 137/81   Pulse 95   Ht '5\' 6"'$  (1.676 m)   Wt 161 lb (73 kg)   LMP 04/06/2022 (Approximate)   SpO2 100%   BMI 25.99 kg/m     Wt Readings from Last 3 Encounters:  07/05/22 161 lb (73 kg)  06/29/22 163 lb 6.4 oz (74.1 kg)  06/15/22 160 lb 9.6 oz (72.8 kg)     GEN: Well nourished, well developed in no acute distress HEENT: Normal NECK: No JVD; No carotid bruits LYMPHATICS: No lymphadenopathy CARDIAC: RRR, 3 out of 6 holosystolic mid ejection murmurs, rubs, gallops RESPIRATORY:  Clear to auscultation without rales, wheezing or rhonchi  ABDOMEN: Soft, non-tender, non-distended MUSCULOSKELETAL:  No edema; No deformity  SKIN: Warm and dry NEUROLOGIC:  Alert and oriented x 3 PSYCHIATRIC:  Normal affect    Risk Assessment/Risk Calculators:     CARPREG II Risk Prediction Index Score:  1.  The patient's risk for a primary cardiac event is 5%.            ASSESSMENT & PLAN:    Murmur Chronic hypertension pregnancy  Her murmur does not fit the characteristics of this in pregnancy murmur.  Therefore it would be appropriate and beneficial to move along and get an echocardiogram in this patient.  This also will help [place] understand she has had very early onset of hypertension.  Blood pressure is acceptable, continue with current antihypertensive regimen.  The patient is in agreement with the above plan. The patient left the office in stable condition.  The patient will follow up in 12 weeks or sooner if needed.  Patient Instructions  Medication  Instructions:  Your physician recommends that you continue on your current medications as directed. Please refer to the Current Medication list given to you today.  *If you need a refill on your cardiac medications before your next appointment, please  call your pharmacy*  Please take your blood pressure daily for 1 week and send in a MyChart message. Please include heart rates.   HOW TO TAKE YOUR BLOOD PRESSURE: Rest 5 minutes before taking your blood pressure. Don't smoke or drink caffeinated beverages for at least 30 minutes before. Take your blood pressure before (not after) you eat. Sit comfortably with your back supported and both feet on the floor (don't cross your legs). Elevate your arm to heart level on a table or a desk. Use the proper sized cuff. It should fit smoothly and snugly around your bare upper arm. There should be enough room to slip a fingertip under the cuff. The bottom edge of the cuff should be 1 inch above the crease of the elbow. Ideally, take 3 measurements at one sitting and record the average.   Lab Work: NONE ordered at this time of appointment   If you have labs (blood work) drawn today and your tests are completely normal, you will receive your results only by: [place] (if you have MyChart) OR A paper copy in the mail If you have any lab test that is abnormal or we need to change your treatment, we will call you to review the results.   Testing/Procedures: Your physician has requested that you have an echocardiogram. Echocardiography is a painless test that uses sound waves to create images of your heart. It provides your doctor with information about the size and shape of your heart and how well your heart's chambers and valves are working. This procedure takes approximately one hour. There are no restrictions for this procedure. Please do NOT wear cologne, perfume, aftershave, or lotions (deodorant is allowed). Please arrive 15 minutes prior to  your appointment time.    Follow-Up: At [facility], you and your health needs are our priority.  As part of our continuing mission to provide you with exceptional heart care, we have created designated Provider Care Teams.  These Care Teams include your primary Cardiologist (physician) and Advanced Practice Providers (APPs -  Physician Assistants and Nurse Practitioners) who all work together to provide you with the care you need, when you need it.  We recommend signing up for the patient portal called "MyChart".  Sign up information is provided on this After Visit Summary.  MyChart is used to connect with patients for Virtual Visits (Telemedicine).  Patients are able to view lab/test results, encounter notes, upcoming appointments, etc.  Non-urgent messages can be sent to your provider as well.   To learn more about what you can do with MyChart, go to [email or web address].    Your next appointment:   12 week(s)  The format for your next appointment:   In Person  Provider:   Berniece Salines, DO  Cooking With Less Salt Cooking with less salt is one way to reduce the amount of sodium you get from food. Sodium is one of the elements that make up salt. It is found naturally in foods and is also added to certain foods. Depending on your condition and overall health, your health care provider or dietitian may recommend that you reduce your sodium intake. Most people should have less than 2,300 milligrams (mg) of sodium each day. If you have high blood pressure (hypertension), you may need to limit your sodium to 1,500 mg each day. Follow the tips below to help reduce your sodium intake. What are tips for eating less sodium? Reading food labels  Check the food label before buying  or using packaged ingredients. Always check the label for the serving size and sodium content. Look for products with no more than 140 mg of sodium in one serving. Check the % Daily Value column to see what  percent of the daily recommended amount of sodium is provided in one serving of the product. Foods with 5% or less in this column are considered low in sodium. Foods with 20% or higher are considered high in sodium. Do not choose foods with salt as one of the first three ingredients on the ingredients list. If salt is one of the first three ingredients, it usually means the item is high in sodium. Shopping Buy sodium-free or low-sodium products. Look for the following words on food labels: Low-sodium. Sodium-free. Reduced-sodium. No salt added. Unsalted. Always check the sodium content even if foods are labeled as low-sodium or no salt added. Buy fresh foods. Cooking Use herbs, seasonings without salt, and spices as substitutes for salt. Use sodium-free baking soda when baking. Grill, braise, or roast foods to add flavor with less salt. Avoid adding salt to pasta, rice, or hot cereals. Drain and rinse canned vegetables, beans, and meat before use. Avoid adding salt when cooking sweets and desserts. Cook with low-sodium ingredients. What foods are high in sodium? Vegetables Regular canned vegetables (not low-sodium or reduced-sodium). Sauerkraut, pickled vegetables, and relishes. Olives. [place] fries. Onion rings. Regular canned tomato sauce and paste. Regular tomato and vegetable juice. Frozen vegetables in sauces. Grains Instant hot cereals. Bread stuffing, pancake, and biscuit mixes. Croutons. Seasoned rice or pasta mixes. Noodle soup cups. Boxed or frozen macaroni and cheese. Regular salted crackers. Self-rising flour. Rolls. Bagels. Flour tortillas and wraps. Meats and other proteins Meat or fish that is salted, canned, smoked, cured, spiced, or pickled. This includes bacon, ham, sausages, hot dogs, corned beef, chipped beef, meat loaves, salt pork, jerky, pickled herring, anchovies, regular canned tuna, and sardines. Salted nuts. Dairy Processed cheese and cheese spreads. Cheese  curds. Blue cheese. Feta cheese. String cheese. Regular cottage cheese. Buttermilk. Canned milk. The items listed above may not be a complete list of foods high in sodium. Actual amounts of sodium may be different depending on processing. Contact a dietitian for more information. What foods are low in sodium? Fruits Fresh, frozen, or canned fruit with no sauce added. Fruit juice. Vegetables Fresh or frozen vegetables with no sauce added. "No salt added" canned vegetables. "No salt added" tomato sauce and paste. Low-sodium or reduced-sodium tomato and vegetable juice. Grains Noodles, pasta, quinoa, rice. Shredded or puffed wheat or puffed rice. Regular or quick oats (not instant). Low-sodium crackers. Low-sodium bread. Whole-grain bread and whole-grain pasta. Unsalted popcorn. Meats and other proteins Fresh or frozen whole meats, poultry (not injected with sodium), and fish with no sauce added. Unsalted nuts. Dried peas, beans, and lentils without added salt. Unsalted canned beans. Eggs. Unsalted nut butters. Low-sodium canned tuna or chicken. Dairy Milk. Soy milk. Yogurt. Low-sodium cheeses, such as Swiss, Monterey Jack, [place], and [employer]. Sherbet or ice cream (keep to  cup per serving). Cream cheese. Fats and oils Unsalted butter or margarine. Other foods Homemade pudding. Sodium-free baking soda and baking powder. Herbs and spices. Low-sodium seasoning mixes. Beverages Coffee and tea. Carbonated beverages. The items listed above may not be a complete list of foods low in sodium. Actual amounts of sodium may be different depending on processing. Contact a dietitian for more information. What are some salt alternatives when cooking? The following are herbs, seasonings, and spices  that can be used instead of salt to flavor your food. Herbs should be fresh or dried. Do not choose packaged mixes. Next to the name of the herb, spice, or seasoning are some examples of foods you can pair it  with. Herbs [place] leaves - Soups, meat and vegetable dishes, and spaghetti sauce. Basil - [employer], soups, pasta, and fish dishes. Cilantro - Meat, poultry, and vegetable dishes. Chili powder - Marinades and Mexican dishes. Chives - Salad dressings and potato dishes. Cumin - Mexican dishes, couscous, and meat dishes. Dill - Fish dishes, sauces, and salads. Fennel - Meat and vegetable dishes, breads, and cookies. Garlic (do not use garlic salt) - [place] dishes, meat dishes, salad dressings, and sauces. Marjoram - Soups, potato dishes, and meat dishes. Oregano - Pizza and spaghetti sauce. Parsley - Salads, soups, pasta, and meat dishes. Rosemary - [place] dishes, salad dressings, soups, and red meats. Saffron - Fish dishes, pasta, and some poultry dishes. Sage - Stuffings and sauces. Tarragon - Fish and [employer]. Thyme - Stuffing, meat, and fish dishes. Seasonings Lemon juice - Fish dishes, poultry dishes, vegetables, and salads. Vinegar - Salad dressings, vegetables, and fish dishes. Spices Cinnamon - Sweet dishes, such as cakes, cookies, and puddings. Cloves - Gingerbread, puddings, and marinades for meats. Curry - Vegetable dishes, fish and poultry dishes, and stir-fry dishes. Ginger - Vegetable dishes, fish dishes, and stir-fry dishes. Nutmeg - Pasta, vegetables, poultry, fish dishes, and custard. Summary Cooking with less salt is one way to reduce the amount of sodium that you get from food. Buy sodium-free or low-sodium products. Check the food label before using or buying packaged ingredients. Use herbs, seasonings without salt, and spices as substitutes for salt in foods. This information is not intended to replace advice given to you by your health care provider. Make sure you discuss any questions you have with your health care provider. Document Revised: 07/18/2019 Document Reviewed: 07/18/2019 Elsevier Patient Education  [place]:   Return in about 12 weeks (around 09/27/2022).   Medication Adjustments/Labs and Tests Ordered: Current medicines are reviewed at length with the patient today.  Concerns regarding medicines are outlined above.  Tests Ordered: Orders Placed This Encounter  Procedures   EKG 12-Lead   ECHOCARDIOGRAM COMPLETE   Medication Changes: No orders of the defined types were placed in this encounter.

## 2022-07-05 NOTE — Patient Instructions (Addendum)
Medication Instructions:  Your physician recommends that you continue on your current medications as directed. Please refer to the Current Medication list given to you today.  *If you need a refill on your cardiac medications before your next appointment, please call your pharmacy*  Please take your blood pressure daily for 1 week and send in a MyChart message. Please include heart rates.   HOW TO TAKE YOUR BLOOD PRESSURE: Rest 5 minutes before taking your blood pressure. Don't smoke or drink caffeinated beverages for at least 30 minutes before. Take your blood pressure before (not after) you eat. Sit comfortably with your back supported and both feet on the floor (don't cross your legs). Elevate your arm to heart level on a table or a desk. Use the proper sized cuff. It should fit smoothly and snugly around your bare upper arm. There should be enough room to slip a fingertip under the cuff. The bottom edge of the cuff should be 1 inch above the crease of the elbow. Ideally, take 3 measurements at one sitting and record the average.   Lab Work: NONE ordered at this time of appointment   If you have labs (blood work) drawn today and your tests are completely normal, you will receive your results only by: [place] (if you have MyChart) OR A paper copy in the mail If you have any lab test that is abnormal or we need to change your treatment, we will call you to review the results.   Testing/Procedures: Your physician has requested that you have an echocardiogram. Echocardiography is a painless test that uses sound waves to create images of your heart. It provides your doctor with information about the size and shape of your heart and how well your heart's chambers and valves are working. This procedure takes approximately one hour. There are no restrictions for this procedure. Please do NOT wear cologne, perfume, aftershave, or lotions (deodorant is allowed). Please arrive 15 minutes  prior to your appointment time.    Follow-Up: At [facility], you and your health needs are our priority.  As part of our continuing mission to provide you with exceptional heart care, we have created designated Provider Care Teams.  These Care Teams include your primary Cardiologist (physician) and Advanced Practice Providers (APPs -  Physician Assistants and Nurse Practitioners) who all work together to provide you with the care you need, when you need it.  We recommend signing up for the patient portal called "MyChart".  Sign up information is provided on this After Visit Summary.  MyChart is used to connect with patients for Virtual Visits (Telemedicine).  Patients are able to view lab/test results, encounter notes, upcoming appointments, etc.  Non-urgent messages can be sent to your provider as well.   To learn more about what you can do with MyChart, go to [email or web address].    Your next appointment:   12 week(s)  The format for your next appointment:   In Person  Provider:   Berniece Salines, DO  Cooking With Less Salt Cooking with less salt is one way to reduce the amount of sodium you get from food. Sodium is one of the elements that make up salt. It is found naturally in foods and is also added to certain foods. Depending on your condition and overall health, your health care provider or dietitian may recommend that you reduce your sodium intake. Most people should have less than 2,300 milligrams (mg) of sodium each day. If you have high blood  pressure (hypertension), you may need to limit your sodium to 1,500 mg each day. Follow the tips below to help reduce your sodium intake. What are tips for eating less sodium? Reading food labels  Check the food label before buying or using packaged ingredients. Always check the label for the serving size and sodium content. Look for products with no more than 140 mg of sodium in one serving. Check the % Daily Value column to see  what percent of the daily recommended amount of sodium is provided in one serving of the product. Foods with 5% or less in this column are considered low in sodium. Foods with 20% or higher are considered high in sodium. Do not choose foods with salt as one of the first three ingredients on the ingredients list. If salt is one of the first three ingredients, it usually means the item is high in sodium. Shopping Buy sodium-free or low-sodium products. Look for the following words on food labels: Low-sodium. Sodium-free. Reduced-sodium. No salt added. Unsalted. Always check the sodium content even if foods are labeled as low-sodium or no salt added. Buy fresh foods. Cooking Use herbs, seasonings without salt, and spices as substitutes for salt. Use sodium-free baking soda when baking. Grill, braise, or roast foods to add flavor with less salt. Avoid adding salt to pasta, rice, or hot cereals. Drain and rinse canned vegetables, beans, and meat before use. Avoid adding salt when cooking sweets and desserts. Cook with low-sodium ingredients. What foods are high in sodium? Vegetables Regular canned vegetables (not low-sodium or reduced-sodium). Sauerkraut, pickled vegetables, and relishes. Olives. [place] fries. Onion rings. Regular canned tomato sauce and paste. Regular tomato and vegetable juice. Frozen vegetables in sauces. Grains Instant hot cereals. Bread stuffing, pancake, and biscuit mixes. Croutons. Seasoned rice or pasta mixes. Noodle soup cups. Boxed or frozen macaroni and cheese. Regular salted crackers. Self-rising flour. Rolls. Bagels. Flour tortillas and wraps. Meats and other proteins Meat or fish that is salted, canned, smoked, cured, spiced, or pickled. This includes bacon, ham, sausages, hot dogs, corned beef, chipped beef, meat loaves, salt pork, jerky, pickled herring, anchovies, regular canned tuna, and sardines. Salted nuts. Dairy Processed cheese and cheese spreads. Cheese  curds. Blue cheese. Feta cheese. String cheese. Regular cottage cheese. Buttermilk. Canned milk. The items listed above may not be a complete list of foods high in sodium. Actual amounts of sodium may be different depending on processing. Contact a dietitian for more information. What foods are low in sodium? Fruits Fresh, frozen, or canned fruit with no sauce added. Fruit juice. Vegetables Fresh or frozen vegetables with no sauce added. "No salt added" canned vegetables. "No salt added" tomato sauce and paste. Low-sodium or reduced-sodium tomato and vegetable juice. Grains Noodles, pasta, quinoa, rice. Shredded or puffed wheat or puffed rice. Regular or quick oats (not instant). Low-sodium crackers. Low-sodium bread. Whole-grain bread and whole-grain pasta. Unsalted popcorn. Meats and other proteins Fresh or frozen whole meats, poultry (not injected with sodium), and fish with no sauce added. Unsalted nuts. Dried peas, beans, and lentils without added salt. Unsalted canned beans. Eggs. Unsalted nut butters. Low-sodium canned tuna or chicken. Dairy Milk. Soy milk. Yogurt. Low-sodium cheeses, such as Swiss, Monte[place] Jack, [place], and [employer]. Sherbet or ice cream (keep to  cup per serving). Cream cheese. Fats and oils Unsalted butter or margarine. Other foods Homemade pudding. Sodium-free baking soda and baking powder. Herbs and spices. Low-sodium seasoning mixes. Beverages Coffee and tea. Carbonated beverages. The items listed above  may not be a complete list of foods low in sodium. Actual amounts of sodium may be different depending on processing. Contact a dietitian for more information. What are some salt alternatives when cooking? The following are herbs, seasonings, and spices that can be used instead of salt to flavor your food. Herbs should be fresh or dried. Do not choose packaged mixes. Next to the name of the herb, spice, or seasoning are some examples of foods you can pair it  with. Herbs [place] leaves - Soups, meat and vegetable dishes, and spaghetti sauce. Basil - [employer], soups, pasta, and fish dishes. Cilantro - Meat, poultry, and vegetable dishes. Chili powder - Marinades and Mexican dishes. Chives - Salad dressings and potato dishes. Cumin - Mexican dishes, couscous, and meat dishes. Dill - Fish dishes, sauces, and salads. Fennel - Meat and vegetable dishes, breads, and cookies. Garlic (do not use garlic salt) - [place] dishes, meat dishes, salad dressings, and sauces. Marjoram - Soups, potato dishes, and meat dishes. Oregano - Pizza and spaghetti sauce. Parsley - Salads, soups, pasta, and meat dishes. Rosemary - [place] dishes, salad dressings, soups, and red meats. Saffron - Fish dishes, pasta, and some poultry dishes. Sage - Stuffings and sauces. Tarragon - Fish and [employer]. Thyme - Stuffing, meat, and fish dishes. Seasonings Lemon juice - Fish dishes, poultry dishes, vegetables, and salads. Vinegar - Salad dressings, vegetables, and fish dishes. Spices Cinnamon - Sweet dishes, such as cakes, cookies, and puddings. Cloves - Gingerbread, puddings, and marinades for meats. Curry - Vegetable dishes, fish and poultry dishes, and stir-fry dishes. Ginger - Vegetable dishes, fish dishes, and stir-fry dishes. Nutmeg - Pasta, vegetables, poultry, fish dishes, and custard. Summary Cooking with less salt is one way to reduce the amount of sodium that you get from food. Buy sodium-free or low-sodium products. Check the food label before using or buying packaged ingredients. Use herbs, seasonings without salt, and spices as substitutes for salt in foods. This information is not intended to replace advice given to you by your health care provider. Make sure you discuss any questions you have with your health care provider. Document Revised: 07/18/2019 Document Reviewed: 07/18/2019 Elsevier Patient Education  [place].

## 2022-07-12 ENCOUNTER — Encounter: Payer: Self-pay | Admitting: Cardiology

## 2022-07-16 ENCOUNTER — Ambulatory Visit (INDEPENDENT_AMBULATORY_CARE_PROVIDER_SITE_OTHER): Payer: Medicaid Other | Admitting: Medical

## 2022-07-16 ENCOUNTER — Encounter (HOSPITAL_BASED_OUTPATIENT_CLINIC_OR_DEPARTMENT_OTHER): Payer: Self-pay | Admitting: Medical

## 2022-07-16 VITALS — BP 139/89 | HR 90 | Wt 160.2 lb

## 2022-07-16 DIAGNOSIS — O10919 Unspecified pre-existing hypertension complicating pregnancy, unspecified trimester: Secondary | ICD-10-CM

## 2022-07-16 DIAGNOSIS — Z3A14 14 weeks gestation of pregnancy: Secondary | ICD-10-CM

## 2022-07-16 DIAGNOSIS — R87612 Low grade squamous intraepithelial lesion on cytologic smear of cervix (LGSIL): Secondary | ICD-10-CM

## 2022-07-16 DIAGNOSIS — O0990 Supervision of high risk pregnancy, unspecified, unspecified trimester: Secondary | ICD-10-CM

## 2022-07-16 DIAGNOSIS — D563 Thalassemia minor: Secondary | ICD-10-CM

## 2022-07-16 NOTE — Progress Notes (Signed)
   PRENATAL VISIT NOTE  Subjective:  Diana Robinson is a 21 y.o. G1P0000 at 42w3dbeing seen today for ongoing prenatal care.  She is currently monitored for the following issues for this high-risk pregnancy and has Frequent headaches; Supervision of high risk pregnancy, antepartum; Murmur; Chronic hypertension in pregnancy; and LGSIL on Pap smear of cervix on their problem list.  Patient reports no complaints.  Contractions: Not present. Vag. Bleeding: None.  Movement: Absent. Denies leaking of fluid.   The following portions of the patient's history were reviewed and updated as appropriate: allergies, current medications, past family history, past medical history, past social history, past surgical history and problem list.   Objective:   Vitals:   07/16/22 1022  BP: 139/89  Pulse: 90  Weight: 160 lb 3.2 oz (72.7 kg)    Fetal Status: Fetal Heart Rate (bpm): 155   Movement: Absent     General:  Alert, oriented and cooperative. Patient is in no acute distress.  Skin: Skin is warm and dry. No rash noted.   Cardiovascular: Normal heart rate noted  Respiratory: Normal respiratory effort, no problems with respiration noted  Abdomen: Soft, gravid, appropriate for gestational age.  Pain/Pressure: Absent     Pelvic: Cervical exam deferred        Extremities: Normal range of motion.  Edema: None  Mental Status: Normal mood and affect. Normal behavior. Normal judgment and thought content.   Assessment and Plan:  Pregnancy: G1P0000 at 152w3d. Supervision of high risk pregnancy, antepartum - Anatomy USKoreacheduled 08/17/22  2. Chronic hypertension in pregnancy - On procardia, normotensive today  - Advised to start BASA   3. [redacted] weeks gestation of pregnancy  4. LGSIL on Pap smear of cervix - Repeat in 1 year   5. Alpha Thalassemia carrier  - Partner kit given   Preterm labor symptoms and general obstetric precautions including but not limited to vaginal bleeding, contractions, leaking of  fluid and fetal movement were reviewed in detail with the patient. Please refer to After Visit Summary for other counseling recommendations.   Return in about 4 weeks (around 08/13/2022) for LOB, In-Person, as scheduled.  Future Appointments  Date Time Provider [place]12/20/2023 11:35 AM [facility]  08/17/2022  9:30 AM [facility] NURSE [facility]04[facility]2023  9:45 AM [facility] US[facility]20[facility]  1:15 PM Leftwich-Kirby, LiKathie DikeCNM [facility]-OBGYN [facility]  09/13/2022  2:15 PM MiMegan SalonMD [facility]-OBGYN [facility]  10/01/2022  8:40 AM ToBerniece SalinesDO [facility] None  10/11/2022  9:55 AM MiMegan SalonMD [facility]N [facility]  11/17/2022 10:15 AM MiMegan SalonMD [facility]  12/02/2022 11:15 AM MiMegan SalonMD [facility]  12/16/2022 10:15 AM MiMegan SalonMD [facility]  12/23/2022 10:35 AM MiMegan SalonMD [facility]  12/30/2022 11:15 AM MiMegan SalonMD [facility]  01/06/2023 10:35 AM MiMegan SalonMD [facility]  01/13/2023 10:15 AM MiMegan SalonMD [facility]OBGYN [facility]    JuKerry HoughPA-C

## 2022-07-16 NOTE — Assessment & Plan Note (Signed)
[  x] Aspirin 81 mg daily after 12 weeks Current antihypertensives:  Procardia XL   Baseline and surveillance labs (pulled in from [facility], refresh links as needed)  Lab Results  Component Value Date   PLT 240 05/31/2022   CREATININE 0.79 06/15/2022   AST 17 06/15/2022   ALT 9 06/15/2022    Antenatal Testing CHTN   Group I   BP < 140/90, no meds, no preeclampsia, AGA, nml AFV   Group II  BP > 140/90, on meds, no preeclampsia, AGA, nml AFV  24-28-32-36  24-28-32-36  None  32  38-39.6  37-39  (36-37 or earlier for poor control)  Pre-eclampsia  GHTN or Preeclampsia without severe features   Preeclampsia with severe features   Q 4 wks  Q 2 wks  28  Inpatient  37-37.6  PRN or 34

## 2022-07-27 NOTE — Telephone Encounter (Signed)
Sent for review

## 2022-07-28 ENCOUNTER — Ambulatory Visit (HOSPITAL_COMMUNITY): Payer: Medicaid Other | Attending: Cardiology

## 2022-07-28 DIAGNOSIS — R011 Cardiac murmur, unspecified: Secondary | ICD-10-CM | POA: Insufficient documentation

## 2022-07-28 LAB — ECHOCARDIOGRAM COMPLETE
Area-P 1/2: 4.05 cm2
S' Lateral: 2.8 cm

## 2022-08-09 NOTE — L&D Delivery Note (Addendum)
OB/GYN Faculty Practice Delivery Note  Diana Robinson is a 22 y.o. G1P0000 s/p SVD at [redacted]w[redacted]d. She was admitted for IOL for cHTN.   ROM: 13h 31m with Clear fluid GBS Status:  Negative/-- (05/09 0156) Maximum Maternal Temperature:  Temp (24hrs), Avg:98.1 F (36.7 C), Min:97.8 F (36.6 C), Max:98.4 F (36.9 C)    Labor Progress: Patient arrived at 4.5 cm dilation and was induced with pit, AROM.   Delivery Date/Time: 01/06/2023 at [phone] Delivery: Called to room and patient was complete and pushing on hands and knees. Head delivered in direct OA position. No nuchal cord present. Shoulder dystocia, attempted to resolve on hands and knees but ultimately rotated patient to her back and resolved with suprapubic pressure and McRoberts.  Infant with spontaneous cry, placed on mother's abdomen, dried and stimulated. Cord clamped x 2 after 1-minute delay, and cut by FOB. Cord blood drawn. Placenta delivered spontaneously with gentle cord traction. Fundus firm with massage and Pitocin. Labia, perineum, vagina, and cervix inspected with 2nd degree perineal laceration repaired withj 3-0 vicryl.   Placenta:  spontaneous, intact, 3 vessel cord  Complications: Shoulder dystocia resolved with McRoberts and suprapubic pressure  Lacerations: 2nd degree perineal repaired  EBL: 600 mL Analgesia: Lidocaine    Infant: APGAR (1 MIN): 8   APGAR (5 MINS): 9   APGAR (10 MINS):    Weight: Pending   Derrel Nip, MD  OB Fellow  01/06/2023 10:12 AM

## 2022-08-17 ENCOUNTER — Ambulatory Visit: Payer: Medicaid Other | Attending: Maternal & Fetal Medicine

## 2022-08-17 ENCOUNTER — Ambulatory Visit: Payer: Medicaid Other | Admitting: *Deleted

## 2022-08-17 ENCOUNTER — Ambulatory Visit (INDEPENDENT_AMBULATORY_CARE_PROVIDER_SITE_OTHER): Payer: Medicaid Other | Admitting: Advanced Practice Midwife

## 2022-08-17 ENCOUNTER — Ambulatory Visit: Payer: Medicaid Other | Attending: Obstetrics | Admitting: Obstetrics

## 2022-08-17 ENCOUNTER — Other Ambulatory Visit: Payer: Self-pay | Admitting: *Deleted

## 2022-08-17 VITALS — BP 123/79 | HR 97

## 2022-08-17 VITALS — BP 127/82 | HR 90 | Wt 164.0 lb

## 2022-08-17 DIAGNOSIS — O0990 Supervision of high risk pregnancy, unspecified, unspecified trimester: Secondary | ICD-10-CM | POA: Diagnosis present

## 2022-08-17 DIAGNOSIS — O3412 Maternal care for benign tumor of corpus uteri, second trimester: Secondary | ICD-10-CM

## 2022-08-17 DIAGNOSIS — O10912 Unspecified pre-existing hypertension complicating pregnancy, second trimester: Secondary | ICD-10-CM

## 2022-08-17 DIAGNOSIS — O0991 Supervision of high risk pregnancy, unspecified, first trimester: Secondary | ICD-10-CM | POA: Insufficient documentation

## 2022-08-17 DIAGNOSIS — D563 Thalassemia minor: Secondary | ICD-10-CM

## 2022-08-17 DIAGNOSIS — Z3A19 19 weeks gestation of pregnancy: Secondary | ICD-10-CM

## 2022-08-17 DIAGNOSIS — D259 Leiomyoma of uterus, unspecified: Secondary | ICD-10-CM | POA: Diagnosis not present

## 2022-08-17 DIAGNOSIS — O161 Unspecified maternal hypertension, first trimester: Secondary | ICD-10-CM

## 2022-08-17 DIAGNOSIS — O285 Abnormal chromosomal and genetic finding on antenatal screening of mother: Secondary | ICD-10-CM | POA: Diagnosis not present

## 2022-08-17 DIAGNOSIS — O10012 Pre-existing essential hypertension complicating pregnancy, second trimester: Secondary | ICD-10-CM

## 2022-08-17 DIAGNOSIS — O10919 Unspecified pre-existing hypertension complicating pregnancy, unspecified trimester: Secondary | ICD-10-CM

## 2022-08-17 DIAGNOSIS — R87612 Low grade squamous intraepithelial lesion on cytologic smear of cervix (LGSIL): Secondary | ICD-10-CM

## 2022-08-17 DIAGNOSIS — R011 Cardiac murmur, unspecified: Secondary | ICD-10-CM

## 2022-08-17 MED ORDER — NIFEDIPINE ER OSMOTIC RELEASE 60 MG PO TB24: 60.0000 mg | ORAL_TABLET | Freq: Every day | ORAL | 6 refills | Status: DC

## 2022-08-17 NOTE — Progress Notes (Signed)
MFM Note  Diana Robinson was seen for a detailed fetal anatomy scan due to chronic hypertension treated with Procardia 60 mg daily.  She denies any other significant past medical history and denies any problems in her current pregnancy.    She had a cell free DNA test earlier in her pregnancy which indicated a low risk for trisomy 51, 74, and 13. A female fetus is predicted.   She was informed that the fetal growth and amniotic fluid level were appropriate for her gestational age.   There were no obvious fetal anomalies noted on today's ultrasound exam.  The patient was informed that anomalies may be missed due to technical limitations. If the fetus is in a suboptimal position or maternal habitus is increased, visualization of the fetus in the maternal uterus may be impaired.  The following were discussed during today's consultation:  Chronic hypertension in pregnancy  The implications and management of chronic hypertension in pregnancy was discussed.   The patient was advised that should her blood pressures be elevated later in her pregnancy, the dosage of her antihypertensive medications may need to be increased.    The increased risk of superimposed preeclampsia, an indicated preterm delivery, and possible fetal growth restriction due to chronic hypertension in pregnancy was discussed.   We will continue to follow her with monthly growth scans.   Weekly fetal testing should be started at around 32 weeks.   To decrease her risk of superimposed preeclampsia, she should start taking a daily baby aspirin (81 mg daily) for preeclampsia prophylaxis.   Fibroids and pregnancy  A 4 to 5 cm posterior fibroid was noted today.  The increased risk of maternal pain issues and fetal growth issues later in her pregnancy due to the fibroids was discussed.   The patient stated that all of her questions were answered today.  A follow-up growth scan was scheduled in 4 weeks.  A total of 30 minutes  was spent counseling and coordinating the care for this patient.  Greater than 50% of the time was spent in direct face-to-face contact.

## 2022-08-17 NOTE — Progress Notes (Signed)
   PRENATAL VISIT NOTE  Subjective:  Diana Robinson is a 22 y.o. G1P0000 at 60w0dbeing seen today for ongoing prenatal care.  She is currently monitored for the following issues for this high-risk pregnancy and has Frequent headaches; Supervision of high risk pregnancy, antepartum; Murmur; Chronic hypertension in pregnancy; LGSIL on Pap smear of cervix; and Alpha thalassemia silent carrier on their problem list.  Patient reports no complaints.  Contractions: Not present. Vag. Bleeding: None.  Movement: Present. Denies leaking of fluid.   The following portions of the patient's history were reviewed and updated as appropriate: allergies, current medications, past family history, past medical history, past social history, past surgical history and problem list.   Objective:   Vitals:   08/17/22 1319  BP: 127/82  Pulse: 90  Weight: 164 lb (74.4 kg)    Fetal Status: Fetal Heart Rate (bpm): 153   Movement: Present     General:  Alert, oriented and cooperative. Patient is in no acute distress.  Skin: Skin is warm and dry. No rash noted.   Cardiovascular: Normal heart rate noted  Respiratory: Normal respiratory effort, no problems with respiration noted  Abdomen: Soft, gravid, appropriate for gestational age.  Pain/Pressure: Absent     Pelvic: Cervical exam deferred        Extremities: Normal range of motion.  Edema: None  Mental Status: Normal mood and affect. Normal behavior. Normal judgment and thought content.   Assessment and Plan:  Pregnancy: G1P0000 at 137w0d. Supervision of high risk pregnancy, antepartum --Anticipatory guidance about next visits/weeks of pregnancy given.   2. Chronic hypertension in pregnancy --Taking Procardia XL 60 daily --Taking BASA  3. Alpha thalassemia silent carrier --Offered partner testing  4. LGSIL on Pap smear of cervix --Repeat Pap 06/2023  5. [redacted] weeks gestation of pregnancy   6. Murmur --Echo wnl, cardiology f/u   Preterm labor  symptoms and general obstetric precautions including but not limited to vaginal bleeding, contractions, leaking of fluid and fetal movement were reviewed in detail with the patient. Please refer to After Visit Summary for other counseling recommendations.   No follow-ups on file.  Future Appointments  Date Time Provider [place]2/12/2022  2:15 PM MiMegan SalonMD [facility]-OBGYN [facility]B  09/14/2022 10:45 AM [facility] NURSE [facility]/01/2023 11:00 AM [facility] US1 [facility]/23/2024  8:40 AM Tobb, KaGodfrey PickDO [facility] None  10/11/2022  9:55 AM MiMegan SalonMD [facility]-OBGYN [facility]B  11/17/2022 10:15 AM MiMegan SalonMD [facility]OB[facility]N [facility]  12/02/2022 11:15 AM MiMegan SalonMD [facility]  12/16/2022 10:15 AM MiMegan SalonMD [facility]OB[facility]N [facility]  12/23/2022 10:35 AM MiMegan SalonMD [facility]N [facility]  12/30/2022 11:15 AM MiMegan SalonMD [facility]  01/06/2023 10:35 AM MiMegan SalonMD [facility]  01/13/2023 10:15 AM MiMegan SalonMD [facility]OB[facility]N [facility]    LiFatima BlankCNM

## 2022-08-19 LAB — AFP, SERUM, OPEN SPINA BIFIDA
AFP MoM: 1.72
AFP Value: 89.7 ng/mL
Gest. Age on Collection Date: 19 weeks
Maternal Age At EDD: 23.1 yr
OSBR Risk 1 IN: 3103
Test Results:: NEGATIVE
Weight: 164 [lb_av]

## 2022-08-30 ENCOUNTER — Telehealth (HOSPITAL_BASED_OUTPATIENT_CLINIC_OR_DEPARTMENT_OTHER): Payer: Self-pay | Admitting: *Deleted

## 2022-08-30 NOTE — Telephone Encounter (Signed)
Called pt in response to elevated BP entered into babyscripts. LMOVM for pt to call office

## 2022-09-02 ENCOUNTER — Ambulatory Visit
Admission: EM | Admit: 2022-09-02 | Discharge: 2022-09-02 | Disposition: A | Discharge status: Home / Self Care | Payer: Medicaid Other | Attending: Urgent Care | Admitting: Urgent Care

## 2022-09-02 DIAGNOSIS — J019 Acute sinusitis, unspecified: Secondary | ICD-10-CM | POA: Diagnosis not present

## 2022-09-02 DIAGNOSIS — Z3A21 21 weeks gestation of pregnancy: Secondary | ICD-10-CM | POA: Diagnosis not present

## 2022-09-02 MED ORDER — AMOXICILLIN 875 MG PO TABS: 875.0000 mg | ORAL_TABLET | Freq: Two times a day (BID) | ORAL | 0 refills | Status: DC

## 2022-09-02 MED ORDER — PSEUDOEPHEDRINE HCL 30 MG PO TABS: 30.0000 mg | ORAL_TABLET | Freq: Three times a day (TID) | ORAL | 0 refills | Status: DC | PRN

## 2022-09-02 MED ORDER — CETIRIZINE HCL 10 MG PO TABS: 10.0000 mg | ORAL_TABLET | Freq: Every day | ORAL | 0 refills | Status: DC

## 2022-09-02 NOTE — ED Triage Notes (Addendum)
Pt c/o cough and blood in nasal secretions x 2 weeks-NAD-steady gait-pt is [redacted] weeks pregnant

## 2022-09-02 NOTE — ED Provider Notes (Signed)
[facility]  Note:  This document was prepared using Systems analyst and may include unintentional dictation errors.  MRN: [id number] DOB: 30-Dec-2000  Subjective:   Diana Robinson is a 22 y.o. female presenting for 2 week history of acute onset persistent sinus congestion, coughing. Has had slight blood in the mucus. Patient is [redacted] weeks pregnant.   No current facility-administered medications for this encounter.  Current Outpatient Medications:    aspirin 81 MG chewable tablet, Chew 1 tablet (81 mg total) by mouth daily., Disp: 30 tablet, Rfl: 9   NIFEdipine (PROCARDIA XL) 60 MG 24 hr tablet, Take 1 tablet (60 mg total) by mouth daily., Disp: 30 tablet, Rfl: 6   Prenatal Vit-Fe Fumarate-FA (PRENATAL VITAMIN) 27-0.8 MG TABS, Take 1 tablet by mouth daily., Disp: 30 tablet, Rfl:    No Known Allergies  Past Medical History:  Diagnosis Date   Heart murmur    Hypertension    Migraine 05/2019   Vitamin D deficiency 10/2020     Past Surgical History:  Procedure Laterality Date   WISDOM TOOTH EXTRACTION      Family History  Problem Relation Age of Onset   Hypertension Mother    Healthy Mother    Healthy Father    Asthma Neg Hx    Cancer Neg Hx    Diabetes Neg Hx    Heart disease Neg Hx     Social History   Tobacco Use   Smoking status: Never   Smokeless tobacco: Never  Vaping Use   Vaping Use: Never used  Substance Use Topics   Alcohol use: Never   Drug use: Never    ROS   Objective:   Vitals: BP 130/89 (BP Location: Left Arm)   Pulse 94   Temp 99.1 F (37.3 C) (Oral)   Resp 16   LMP 04/06/2022 (Approximate)   SpO2 98%   Physical Exam Constitutional:      General: She is not in acute distress.    Appearance: Normal appearance. She is well-developed and normal weight. She is not ill-appearing, toxic-appearing or diaphoretic.  HENT:     Head: Normocephalic and atraumatic.     Right Ear: Tympanic membrane, ear  canal and external ear normal. No drainage or tenderness. No middle ear effusion. There is no impacted cerumen. Tympanic membrane is not erythematous or bulging.     Left Ear: Tympanic membrane, ear canal and external ear normal. No drainage or tenderness.  No middle ear effusion. There is no impacted cerumen. Tympanic membrane is not erythematous or bulging.     Nose: Congestion present. No rhinorrhea.     Comments: Nasal mucosa boggy and erythematous.     Mouth/Throat:     Mouth: Mucous membranes are moist. No oral lesions.     Pharynx: No pharyngeal swelling, oropharyngeal exudate, posterior oropharyngeal erythema or uvula swelling.     Tonsils: No tonsillar exudate or tonsillar abscesses.  Eyes:     General: No scleral icterus.       Right eye: No discharge.        Left eye: No discharge.     Extraocular Movements: Extraocular movements intact.     Right eye: Normal extraocular motion.     Left eye: Normal extraocular motion.     Conjunctiva/sclera: Conjunctivae normal.  Cardiovascular:     Rate and Rhythm: Normal rate and regular rhythm.     Heart sounds: Normal heart sounds. No murmur heard.  No friction rub. No gallop.  Pulmonary:     Effort: Pulmonary effort is normal. No respiratory distress.     Breath sounds: No stridor. No wheezing, rhonchi or rales.  Chest:     Chest wall: No tenderness.  Musculoskeletal:     Cervical back: Normal range of motion and neck supple.  Lymphadenopathy:     Cervical: No cervical adenopathy.  Skin:    General: Skin is warm and dry.  Neurological:     General: No focal deficit present.     Mental Status: She is alert and oriented to person, place, and time.  Psychiatric:        Mood and Affect: Mood normal.        Behavior: Behavior normal.     Assessment and Plan :   PDMP not reviewed this encounter.  1. Acute sinusitis, recurrence not specified, unspecified location   2. [redacted] weeks gestation of pregnancy     Will start empiric  treatment for sinusitis with amoxicillin.  Recommended supportive care otherwise. Counseled patient on potential for adverse effects with medications prescribed/recommended today, ER and return-to-clinic precautions discussed, patient verbalized understanding.    Jaynee Eagles, [place] 09/02/22 1523

## 2022-09-13 ENCOUNTER — Ambulatory Visit (INDEPENDENT_AMBULATORY_CARE_PROVIDER_SITE_OTHER): Payer: Medicaid Other | Admitting: Obstetrics & Gynecology

## 2022-09-13 VITALS — BP 122/80 | HR 85 | Wt 169.4 lb

## 2022-09-13 DIAGNOSIS — Z3A23 23 weeks gestation of pregnancy: Secondary | ICD-10-CM

## 2022-09-13 DIAGNOSIS — K219 Gastro-esophageal reflux disease without esophagitis: Secondary | ICD-10-CM

## 2022-09-13 DIAGNOSIS — O0990 Supervision of high risk pregnancy, unspecified, unspecified trimester: Secondary | ICD-10-CM

## 2022-09-13 DIAGNOSIS — D563 Thalassemia minor: Secondary | ICD-10-CM

## 2022-09-13 DIAGNOSIS — D251 Intramural leiomyoma of uterus: Secondary | ICD-10-CM

## 2022-09-13 DIAGNOSIS — O10919 Unspecified pre-existing hypertension complicating pregnancy, unspecified trimester: Secondary | ICD-10-CM

## 2022-09-13 DIAGNOSIS — R87612 Low grade squamous intraepithelial lesion on cytologic smear of cervix (LGSIL): Secondary | ICD-10-CM

## 2022-09-13 MED ORDER — FAMOTIDINE 20 MG PO TABS: 20.0000 mg | ORAL_TABLET | Freq: Two times a day (BID) | ORAL | 2 refills | Status: DC

## 2022-09-13 NOTE — Progress Notes (Unsigned)
   PRENATAL VISIT NOTE  Subjective:  Diana Robinson is a 22 y.o. G1P0000 at 85w6dbeing seen today for ongoing prenatal care.  She is currently monitored for the following issues for this high-risk pregnancy and has Frequent headaches; Supervision of high risk pregnancy, antepartum; Murmur; Chronic hypertension in pregnancy; LGSIL on Pap smear of cervix; and Alpha thalassemia silent carrier on their problem list.  Patient reports  some reflux.  Tums and rolaids don't really help.  MIlk does but give her GI distress.  Wants some other recommendations .  Contractions: Not present. Vag. Bleeding: None.  Movement: Present. Denies leaking of fluid.   The following portions of the patient's history were reviewed and updated as appropriate: allergies, current medications, past family history, past medical history, past social history, past surgical history and problem list.   Objective:   Vitals:   09/13/22 1444  BP: 122/80  Pulse: 85  Weight: 169 lb 6.4 oz (76.8 kg)    Fetal Status: Fetal Heart Rate (bpm): 146   Movement: Present     General:  Alert, oriented and cooperative. Patient is in no acute distress.  Skin: Skin is warm and dry. No rash noted.   Cardiovascular: Normal heart rate noted  Respiratory: Normal respiratory effort, no problems with respiration noted  Abdomen: Soft, gravid, appropriate for gestational age.  Pain/Pressure: Absent     Pelvic: Cervical exam deferred        Extremities: Normal range of motion.  Edema: Trace  Mental Status: Normal mood and affect. Normal behavior. Normal judgment and thought content.   Assessment and Plan:  Pregnancy: G1P0000 at 283w6d. Supervision of high risk pregnancy, antepartum - recheck 4 weeks.  GTT and lab work that visit.  2. LGSIL on Pap smear of cervix - repeat PP  3. Alpha thalassemia silent carrier - FOB has been given test kit.  Not done at this point  4. Chronic hypertension in pregnancy - on procardia and has follow up  Dr. GrWyatt Haste5. 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 Gastroesophageal reflux disease without esophagitis - will try pepcid for treatment - famotidine (PEPCID) 20 MG tablet; Take 1 tablet (20 mg total) by mouth 2 (two) times daily.  Dispense: 60 tablet; Refill: 2  Preterm labor symptoms and general obstetric precautions including but not limited to vaginal bleeding, contractions, leaking of fluid and fetal movement were reviewed in detail with the patient. Please refer to After Visit Summary for other counseling recommendations.   Return in about 4 weeks (around 10/11/2022).  Future Appointments  Date Time Provider [place]2/01/2023 10:45 AM [facility] NURSE [facility]2/01/2023 11:00 AM [facility] US[facility]  8:40 AM Tobb, Kardie, DO [facility] None  10/11/2022  9:55 AM MiMegan SalonMD [facility]  11/17/2022 10:15 AM MiMegan SalonMD [facility]  12/02/2022 11:15 AM MiMegan SalonMD [facility]  12/16/2022 10:15 AM MiMegan SalonMD [facility]  12/23/2022 10:35 AM MiMegan SalonMD [facility]  12/30/2022 11:15 AM MiMegan SalonMD [facility]  01/06/2023 10:35 AM MiMegan SalonMD [facility]  01/13/2023 10:15 AM MiMegan SalonMD [facility]    MaMegan SalonMD

## 2022-09-14 ENCOUNTER — Other Ambulatory Visit: Payer: Self-pay | Admitting: *Deleted

## 2022-09-14 ENCOUNTER — Ambulatory Visit: Payer: Medicaid Other | Attending: Obstetrics

## 2022-09-14 ENCOUNTER — Ambulatory Visit: Payer: Medicaid Other | Admitting: *Deleted

## 2022-09-14 ENCOUNTER — Encounter: Payer: Self-pay | Admitting: *Deleted

## 2022-09-14 VITALS — BP 123/74 | HR 93

## 2022-09-14 DIAGNOSIS — Z3A23 23 weeks gestation of pregnancy: Secondary | ICD-10-CM

## 2022-09-14 DIAGNOSIS — O10012 Pre-existing essential hypertension complicating pregnancy, second trimester: Secondary | ICD-10-CM | POA: Diagnosis not present

## 2022-09-14 DIAGNOSIS — D563 Thalassemia minor: Secondary | ICD-10-CM

## 2022-09-14 DIAGNOSIS — O10912 Unspecified pre-existing hypertension complicating pregnancy, second trimester: Secondary | ICD-10-CM

## 2022-09-14 DIAGNOSIS — D259 Leiomyoma of uterus, unspecified: Secondary | ICD-10-CM

## 2022-09-14 DIAGNOSIS — O0990 Supervision of high risk pregnancy, unspecified, unspecified trimester: Secondary | ICD-10-CM | POA: Insufficient documentation

## 2022-09-14 DIAGNOSIS — O10919 Unspecified pre-existing hypertension complicating pregnancy, unspecified trimester: Secondary | ICD-10-CM

## 2022-09-14 DIAGNOSIS — O285 Abnormal chromosomal and genetic finding on antenatal screening of mother: Secondary | ICD-10-CM | POA: Diagnosis not present

## 2022-09-22 ENCOUNTER — Ambulatory Visit (INDEPENDENT_AMBULATORY_CARE_PROVIDER_SITE_OTHER): Payer: No Typology Code available for payment source | Admitting: Nurse Practitioner

## 2022-09-22 ENCOUNTER — Encounter: Payer: Self-pay | Admitting: Nurse Practitioner

## 2022-09-22 ENCOUNTER — Other Ambulatory Visit: Payer: Self-pay

## 2022-09-22 ENCOUNTER — Encounter (HOSPITAL_COMMUNITY): Payer: Self-pay | Admitting: Emergency Medicine

## 2022-09-22 ENCOUNTER — Emergency Department (HOSPITAL_COMMUNITY)
Admission: EM | Admit: 2022-09-22 | Discharge: 2022-09-22 | Disposition: A | Discharge status: Home / Self Care | Payer: No Typology Code available for payment source | Attending: Emergency Medicine | Admitting: Emergency Medicine

## 2022-09-22 VITALS — BP 130/82 | HR 84 | Temp 99.3°F | Ht 65.0 in | Wt 171.0 lb

## 2022-09-22 DIAGNOSIS — M62838 Other muscle spasm: Secondary | ICD-10-CM | POA: Diagnosis not present

## 2022-09-22 DIAGNOSIS — Z09 Encounter for follow-up examination after completed treatment for conditions other than malignant neoplasm: Secondary | ICD-10-CM | POA: Diagnosis not present

## 2022-09-22 DIAGNOSIS — I10 Essential (primary) hypertension: Secondary | ICD-10-CM | POA: Diagnosis not present

## 2022-09-22 DIAGNOSIS — O99891 Other specified diseases and conditions complicating pregnancy: Secondary | ICD-10-CM | POA: Insufficient documentation

## 2022-09-22 DIAGNOSIS — Z79899 Other long term (current) drug therapy: Secondary | ICD-10-CM | POA: Diagnosis not present

## 2022-09-22 DIAGNOSIS — M542 Cervicalgia: Secondary | ICD-10-CM

## 2022-09-22 DIAGNOSIS — Z7982 Long term (current) use of aspirin: Secondary | ICD-10-CM | POA: Diagnosis not present

## 2022-09-22 NOTE — Assessment & Plan Note (Signed)
Patient presents with complaints of left-sided neck pain that started this morning after sleeping wrong woke up with her head partially hanging off the bed, she has been taking Tylenol as needed also applying heat.  Currently has aching pain rated 10/10.  She would like a excuse work note written to stay off work till Monday next week.  She was also evaluated at emergency room this morning Hospital after visit summary and recommendations reviewed by me today Continue Tylenol as needed application of heat, ice and stretching exercises encouraged

## 2022-09-22 NOTE — Progress Notes (Signed)
Established Patient Office Visit  Subjective:  Patient ID: Diana Robinson, female    DOB: 10-Nov-2000  Age: 22 y.o. MRN: [id number]  CC:  Chief Complaint  Patient presents with   Hospitalization Follow-up    Neck strain , still having some soreness, pain when moving her head, had a pop in neck about 5 this morning.     HPI Diana Robinson is a 21 y.o. female with past medical history of migraine, hypertension and currently [redacted] weeks pregnant presents with complaints of left-sided neck pain that started this morning after sleeping wrong woke up with her head partially hanging off the bed, she has been taking Tylenol as needed also applying heat.  Currently has aching pain rated 10/10.  She would like a excuse work note written to stay off work till Monday next week.  She was also evaluated at emergency room this morning   Past Medical History:  Diagnosis Date   Heart murmur    Hypertension    Migraine 05/2019   Vitamin D deficiency 10/2020    Past Surgical History:  Procedure Laterality Date   WISDOM TOOTH EXTRACTION      Family History  Problem Relation Age of Onset   Hypertension Mother    Healthy Mother    Healthy Father    Asthma Neg Hx    Cancer Neg Hx    Diabetes Neg Hx    Heart disease Neg Hx     Social History   Socioeconomic History   Marital status: Single    Spouse name: Not on file   Number of children: Not on file   Years of education: Not on file   Highest education level: Not on file  Occupational History   Not on file  Tobacco Use   Smoking status: Never   Smokeless tobacco: Never  Vaping Use   Vaping Use: Never used  Substance and Sexual Activity   Alcohol use: Never   Drug use: Never   Sexual activity: Yes    Birth control/protection: None  Other Topics Concern   Not on file  Social History Narrative   Not on file   Social Determinants of Health   Financial Resource Strain: Low Risk  (05/31/2022)   Overall Financial Resource Strain  (CARDIA)    Difficulty of Paying Living Expenses: Not hard at all  Food Insecurity: No Food Insecurity (05/31/2022)   Hunger Vital Sign    Worried About Running Out of Food in the Last Year: Never true    Ran Out of Food in the Last Year: Never true  Transportation Needs: No Transportation Needs (05/31/2022)   PRAPARE - Hydrologist (Medical): No    Lack of Transportation (Non-Medical): No  Physical Activity: Inactive (05/31/2022)   Exercise Vital Sign    Days of Exercise per Week: 0 days    Minutes of Exercise per Session: 0 min  Stress: No Stress Concern Present (05/31/2022)   [place]    Feeling of Stress : Not at all  Social Connections: Moderately Integrated (05/31/2022)   Social Connection and Isolation Panel [NHANES]    Frequency of Communication with Friends and Family: Twice a week    Frequency of Social Gatherings with Friends and Family: Once a week    Attends Religious Services: More than 4 times per year    Active Member of [employer] or Organizations: No    Attends Archivist Meetings:  Never    Marital Status: Living with partner  Intimate Partner Violence: Not At Risk (05/31/2022)   Humiliation, Afraid, Rape, and Kick questionnaire    Fear of Current or Ex-Partner: No    Emotionally Abused: No    Physically Abused: No    Sexually Abused: No    Outpatient Medications Prior to Visit  Medication Sig Dispense Refill   aspirin 81 MG chewable tablet Chew 1 tablet (81 mg total) by mouth daily. 30 tablet 9   famotidine (PEPCID) 20 MG tablet Take 1 tablet (20 mg total) by mouth 2 (two) times daily. 60 tablet 2   NIFEdipine (PROCARDIA XL) 60 MG 24 hr tablet Take 1 tablet (60 mg total) by mouth daily. 30 tablet 6   Prenatal Vit-Fe Fumarate-FA (PRENATAL VITAMIN) 27-0.8 MG TABS Take 1 tablet by mouth daily. 30 tablet    cetirizine (ZYRTEC ALLERGY) 10 MG tablet Take 1 tablet  (10 mg total) by mouth daily. (Patient not taking: Reported on 09/22/2022) 30 tablet 0   No facility-administered medications prior to visit.    No Known Allergies  ROS Review of Systems  Constitutional:  Negative for activity change, appetite change, chills, diaphoresis, fatigue and fever.  HENT:  Negative for congestion, dental problem, drooling, ear discharge and ear pain.   Respiratory: Negative.  Negative for apnea, cough, choking, chest tightness, shortness of breath, wheezing and stridor.   Cardiovascular: Negative.  Negative for chest pain, palpitations and leg swelling.  Gastrointestinal: Negative.   Musculoskeletal:  Positive for neck pain. Negative for gait problem, myalgias and neck stiffness.  Neurological:  Negative for dizziness, seizures, facial asymmetry, light-headedness, numbness and headaches.  Psychiatric/Behavioral:  Negative for agitation, behavioral problems, confusion, decreased concentration, dysphoric mood and hallucinations.       Objective:    Physical Exam Constitutional:      General: She is not in acute distress.    Appearance: Normal appearance. She is not ill-appearing, toxic-appearing or diaphoretic.  Eyes:     General: No scleral icterus.       Right eye: No discharge.        Left eye: No discharge.  Neck:     Vascular: No carotid bruit.     Comments: Tenderness on range of motion of the neck, skin warm and dry, no redness or swelling noted Cardiovascular:     Rate and Rhythm: Normal rate and regular rhythm.     Pulses: Normal pulses.     Heart sounds: Normal heart sounds. No murmur heard.    No friction rub. No gallop.  Pulmonary:     Effort: Pulmonary effort is normal. No respiratory distress.     Breath sounds: Normal breath sounds. No stridor. No wheezing, rhonchi or rales.  Chest:     Chest wall: No tenderness.  Musculoskeletal:        General: Normal range of motion.     Cervical back: Neck supple. No rigidity.  Lymphadenopathy:      Cervical: No cervical adenopathy.  Skin:    General: Skin is warm and dry.  Neurological:     Mental Status: She is alert and oriented to person, place, and time.     Cranial Nerves: No cranial nerve deficit.     Sensory: No sensory deficit.     Motor: No weakness.     Coordination: Coordination normal.     Gait: Gait normal.     Deep Tendon Reflexes: Reflexes normal.  Psychiatric:  Mood and Affect: Mood normal.        Behavior: Behavior normal.        Thought Content: Thought content normal.        Judgment: Judgment normal.     BP 130/82   Pulse 84   Temp 99.3 F (37.4 C)   Ht 5' 5"$  (1.651 m)   Wt 171 lb (77.6 kg)   LMP 04/06/2022 (Approximate)   SpO2 96%   BMI 28.46 kg/m  Wt Readings from Last 3 Encounters:  09/22/22 171 lb (77.6 kg)  09/22/22 169 lb (76.7 kg)  09/13/22 169 lb 6.4 oz (76.8 kg)    No results found for: "TSH" Lab Results  Component Value Date   WBC 4.8 05/31/2022   HGB 12.6 05/31/2022   HCT 38.1 05/31/2022   MCV 82 05/31/2022   PLT 240 05/31/2022   Lab Results  Component Value Date   NA 137 06/15/2022   K 3.8 06/15/2022   CO2 19 (L) 06/15/2022   GLUCOSE 93 06/15/2022   BUN 13 06/15/2022   CREATININE 0.79 06/15/2022   BILITOT 0.2 06/15/2022   ALKPHOS 82 06/15/2022   AST 17 06/15/2022   ALT 9 06/15/2022   PROT 7.5 06/15/2022   ALBUMIN 4.3 06/15/2022   CALCIUM 9.9 06/15/2022   ANIONGAP 5 05/10/2022   EGFR 108 06/15/2022   Lab Results  Component Value Date   CHOL 158 11/04/2021   Lab Results  Component Value Date   HDL 73 11/04/2021   Lab Results  Component Value Date   LDLCALC 76 11/04/2021   Lab Results  Component Value Date   TRIG 39 11/04/2021   Lab Results  Component Value Date   CHOLHDL 2.2 11/04/2021   Lab Results  Component Value Date   HGBA1C 5.5 04/20/2022      Assessment & Plan:   Problem List Items Addressed This Visit       Other   Neck pain, acute - Primary    Take Tylenol 650 mg  every 6 hours as needed for neck pain Application of heat and ice stretching exercises discussed.       Encounter for examination following treatment at hospital    Patient presents with complaints of left-sided neck pain that started this morning after sleeping wrong woke up with her head partially hanging off the bed, she has been taking Tylenol as needed also applying heat.  Currently has aching pain rated 10/10.  She would like a excuse work note written to stay off work till Monday next week.  She was also evaluated at emergency room this morning Hospital after visit summary and recommendations reviewed by me today Continue Tylenol as needed application of heat, ice and stretching exercises encouraged       No orders of the defined types were placed in this encounter.   Follow-up: Return in about 9 months (around 06/23/2023) for CPE.    Renee Rival, FNP

## 2022-09-22 NOTE — ED Triage Notes (Signed)
Patient arrives ambulatory by POV c/o left sided neck pain after sleeping on it. Denies any numbness or tingling.

## 2022-09-22 NOTE — Patient Instructions (Addendum)
Please take tylenol 650 mg every 6 hours as needed for your neck pain , apply heat and ice as needed.   It is important that you exercise regularly at least 30 minutes 5 times a week as tolerated  Think about what you will eat, plan ahead. Choose " clean, green, fresh or frozen" over canned, processed or packaged foods which are more sugary, salty and fatty. 70 to 75% of food eaten should be vegetables and fruit. Three meals at set times with snacks allowed between meals, but they must be fruit or vegetables. Aim to eat over a 12 hour period , example 7 am to 7 pm, and STOP after  your last meal of the day. Drink water,generally about 64 ounces per day, no other drink is as healthy. Fruit juice is best enjoyed in a healthy way, by EATING the fruit.  Thanks for choosing [facility] we consider it a privelige to serve you.

## 2022-09-22 NOTE — Assessment & Plan Note (Signed)
Take Tylenol 650 mg every 6 hours as needed for neck pain Application of heat and ice stretching exercises discussed.

## 2022-09-22 NOTE — ED Provider Notes (Signed)
[facility] Provider Note   CSN: [id number] Arrival date & time: 09/22/22  [id number]     History  Chief Complaint  Patient presents with   Neck Pain    Diana Robinson is a 22 y.o. female with history significant for migraine, hypertension, and currently [redacted] weeks pregnant presents to the ED complaining of left sided neck pain after "sleeping wrong".  She states when she woke up, her head was partially hanging off the bed.  She reports feeling a "pop" in her neck when she got up.  Since then, she has had pain in her left neck and shoulder that is worse with movement.  Denies numbness, tingling, fall or other injury.        Home Medications Prior to Admission medications   Medication Sig Start Date End Date Taking? Authorizing Provider  aspirin 81 MG chewable tablet Chew 1 tablet (81 mg total) by mouth daily. 06/15/22   Megan Salon, MD  cetirizine (ZYRTEC ALLERGY) 10 MG tablet Take 1 tablet (10 mg total) by mouth daily. 09/02/22   Jaynee Eagles, PA-C  famotidine (PEPCID) 20 MG tablet Take 1 tablet (20 mg total) by mouth 2 (two) times daily. 09/13/22   Megan Salon, MD  NIFEdipine (PROCARDIA XL) 60 MG 24 hr tablet Take 1 tablet (60 mg total) by mouth daily. 08/17/22   Leftwich-Kirby, Kathie Dike, CNM  Prenatal Vit-Fe Fumarate-FA (PRENATAL VITAMIN) 27-0.8 MG TABS Take 1 tablet by mouth daily. 06/15/22   Megan Salon, MD      Allergies    Patient has no known allergies.    Review of Systems   Review of Systems  Musculoskeletal:  Positive for neck pain.    Physical Exam Updated Vital Signs BP 127/83 (BP Location: Left Arm)   Pulse 85   Temp 98.2 F (36.8 C) (Oral)   Resp 16   Ht 5' 5"$  (1.651 m)   Wt 76.7 kg   LMP 04/06/2022 (Approximate)   SpO2 100%   BMI 28.12 kg/m  Physical Exam Vitals and nursing note reviewed.  Constitutional:      General: She is not in acute distress.    Appearance: Normal appearance. She is not ill-appearing or  diaphoretic.  Cardiovascular:     Rate and Rhythm: Normal rate and regular rhythm.  Pulmonary:     Effort: Pulmonary effort is normal.  Musculoskeletal:     Cervical back: Spasms and tenderness present. No deformity, signs of trauma or bony tenderness. Pain with movement present. Decreased range of motion.     Comments: Patient has pain with flexion and extension of the cervical spine.  She also has pain with rotation looking right and left, with left being more painful.  Tenderness to palpation of superior left trapezius muscle and paracervical muscles.  Normal sensation in left shoulder and arm.  Radial pulse is 2+.   Skin:    General: Skin is warm and dry.     Capillary Refill: Capillary refill takes less than 2 seconds.  Neurological:     Mental Status: She is alert. Mental status is at baseline.  Psychiatric:        Mood and Affect: Mood normal.        Behavior: Behavior normal.     ED Results / Procedures / Treatments   Labs (all labs ordered are listed, but only abnormal results are displayed) Labs Reviewed - No data to display  EKG None  Radiology No  results found.  Procedures Procedures    Medications Ordered in ED Medications - No data to display  ED Course/ Medical Decision Making/ A&P                             Medical Decision Making  Patient presents to the ED complaining of left side neck and shoulder pain when she woke this morning.  She reports that she was sleeping and woke with her head hanging off of the bed.  She denies fall or injury.  She states she felt a "pop" and has had increased pain, especially with movement since.  Patient is currently [redacted] weeks pregnant.    Exam significant for an overall well appearing patient who does appear to have discomfort, holding the left side of her neck.  She has pain with flexion and extension of the cervical spine.  She also has pain with rotation right and left with left being more painful.  Tenderness to  palpation of superior region of left trapezius muscle and left paracervical muscles.  Normal ROM of shoulder without increased pain.  Left arm is neurovascularly intact.    I do not feel that imaging is warranted at this time.  Patient's symptoms and physical exam consistent with muscle spasm and strain.  Discussed supportive care including heating pad, massage, and gentle neck exercises.  Discussed use of Tylenol for pain.  Due to patient being pregnant, typical options to treat muscle spasm and pain are not an option.  Recommended patient follow up with her primary care provider if she continues to have pain.    The patient has been appropriately medically screened and/or stabilized in the ED. I have low suspicion for any other emergent medical condition which would require further screening, evaluation or treatment in the ED or require inpatient management. At time of discharge the patient is hemodynamically stable and in no acute distress. I have discussed work-up results and diagnosis with patient and answered all questions. Patient is agreeable with discharge plan. We discussed strict return precautions for returning to the emergency department and they verbalized understanding.           Final Clinical Impression(s) / ED Diagnoses Final diagnoses:  Trapezius muscle spasm  Neck pain    Rx / DC Orders ED Discharge Orders     None         Pat Kocher, [place] 09/22/22 [id number]    Valarie Merino, MD 09/22/22 1430

## 2022-09-22 NOTE — Discharge Instructions (Addendum)
Thank you for allowing me to be a part of your care today.  Your symptoms are consistent with a muscle strain or muscle spasm.  I recommend taking 1000 mg of Tylenol every 6-8 hours as needed for pain.  DO NOT exceed 3000 mg in a 24-hour period.   I also recommend using a heating pad, doing gentle stretches of your neck, and massaging the muscle.  I have provided information and neck exercises.    Follow-up with your primary care provider if you continue to have symptoms.

## 2022-09-27 ENCOUNTER — Encounter: Payer: Self-pay | Admitting: *Deleted

## 2022-09-29 ENCOUNTER — Ambulatory Visit: Payer: Medicaid Other | Admitting: Cardiology

## 2022-10-01 ENCOUNTER — Ambulatory Visit: Payer: No Typology Code available for payment source | Attending: Cardiology | Admitting: Cardiology

## 2022-10-01 ENCOUNTER — Encounter: Payer: Self-pay | Admitting: Cardiology

## 2022-10-01 VITALS — BP 132/80 | HR 97 | Ht 66.0 in | Wt 174.6 lb

## 2022-10-01 DIAGNOSIS — O0990 Supervision of high risk pregnancy, unspecified, unspecified trimester: Secondary | ICD-10-CM

## 2022-10-01 DIAGNOSIS — O10919 Unspecified pre-existing hypertension complicating pregnancy, unspecified trimester: Secondary | ICD-10-CM

## 2022-10-01 DIAGNOSIS — Z3A25 25 weeks gestation of pregnancy: Secondary | ICD-10-CM

## 2022-10-01 NOTE — Progress Notes (Unsigned)
Cardio-Obstetrics Clinic  New Evaluation  Date:  10/03/2022   ID:  Diana Robinson, DOB Feb 01, 2001, MRN [id number]  PCP:  Fenton Foy, NP   [place] Prov[facility] Cardiologist:  Berniece Salines, DO  Electrophysiologist:  None       Referring MD: Fenton Foy, NP   Chief Complaint:   History of Present Illness:    Diana Robinson is a 22 y.o. female [G1P0000] who is being seen today for the evaluation of murmur at the request of Fenton Foy, NP.    Medical history includes hypertension which was diagnosed 3 years ago she is currently on nifedipine, and has been started on aspirin 81 mg for preeclampsia prophylaxis appropriately.    At her last visit, I ordered an echo to assess for any valvular abnormalities. She is here today for a follow up visit.   Prior CV Studies Reviewed: The following studies were reviewed today:  TTE 07/28/2022 IMPRESSIONS     1. Left ventricular ejection fraction, by estimation, is 60 to 65%. Left  ventricular ejection fraction by 3D volume is 67 %. The left ventricle has  normal function. The left ventricle has no regional wall motion  abnormalities. Left ventricular diastolic   parameters were normal. The average left ventricular global longitudinal  strain is -26.1 %. The global longitudinal strain is normal.   2. Right ventricular systolic function is normal. The right ventricular  size is normal. There is normal pulmonary artery systolic pressure.   3. The mitral valve is normal in structure. No evidence of mitral valve  regurgitation. No evidence of mitral stenosis.   4. The aortic valve is tricuspid. Aortic valve regurgitation is not  visualized. No aortic stenosis is present.   5. The inferior vena cava is normal in size with greater than 50%  respiratory variability, suggesting right atrial pressure of 3 mmHg.   Conclusion(s)/Recommendation(s): Normal biventricular function without  evidence of hemodynamically  significant valvular heart disease.   FINDINGS   Left Ventricle: Left ventricular ejection fraction, by estimation, is 60  to 65%. Left ventricular ejection fraction by 3D volume is 67 %. The left  ventricle has normal function. The left ventricle has no regional wall  motion abnormalities. The average  left ventricular global longitudinal strain is -26.1 %. The global  longitudinal strain is normal. The left ventricular internal cavity size  was normal in size. There is no left ventricular hypertrophy. Left  ventricular diastolic parameters were normal.   Right Ventricle: The right ventricular size is normal. No increase in  right ventricular wall thickness. Right ventricular systolic function is  normal. There is normal pulmonary artery systolic pressure. The tricuspid  regurgitant velocity is 2.10 m/s, and   with an assumed right atrial pressure of 3 mmHg, the estimated right  ventricular systolic pressure is 123XX123 mmHg.   Left Atrium: Left atrial size was normal in size.   Right Atrium: Right atrial size was normal in size.   Pericardium: There is no evidence of pericardial effusion.   Mitral Valve: The mitral valve is normal in structure. No evidence of  mitral valve regurgitation. No evidence of mitral valve stenosis.   Tricuspid Valve: The tricuspid valve is normal in structure. Tricuspid  valve regurgitation is trivial. No evidence of tricuspid stenosis.   Aortic Valve: The aortic valve is tricuspid. Aortic valve regurgitation is  not visualized. No aortic stenosis is present.   Pulmonic Valve: The pulmonic valve was normal in structure. Pulmonic valve  regurgitation is trivial. No evidence of pulmonic stenosis.   Aorta: The aortic root is normal in size and structure.   Venous: The inferior vena cava is normal in size with greater than 50%  respiratory variability, suggesting right atrial pressure of 3 mmHg.   IAS/Shunts: No atrial level shunt detected by color flow  Doppler.       Past Medical History:  Diagnosis Date   Heart murmur    Hypertension    Migraine 05/2019   Vitamin D deficiency 10/2020    Past Surgical History:  Procedure Laterality Date   WISDOM TOOTH EXTRACTION        OB History     Gravida  1   Para  0   Term  0   Preterm  0   AB  0   Living  0      SAB  0   IAB  0   Ectopic  0   Multiple  0   Live Births  0               Current Medications: Current Meds  Medication Sig   aspirin 81 MG chewable tablet Chew 1 tablet (81 mg total) by mouth daily.   famotidine (PEPCID) 20 MG tablet Take 1 tablet (20 mg total) by mouth 2 (two) times daily.   NIFEdipine (PROCARDIA XL) 60 MG 24 hr tablet Take 1 tablet (60 mg total) by mouth daily.   Prenatal Vit-Fe Fumarate-FA (PRENATAL VITAMIN) 27-0.8 MG TABS Take 1 tablet by mouth daily.     Allergies:   Patient has no known allergies.   Social History   Socioeconomic History   Marital status: Single    Spouse name: Not on file   Number of children: Not on file   Years of education: Not on file   Highest education level: Not on file  Occupational History   Not on file  Tobacco Use   Smoking status: Never   Smokeless tobacco: Never  Vaping Use   Vaping Use: Never used  Substance and Sexual Activity   Alcohol use: Never   Drug use: Never   Sexual activity: Yes    Birth control/protection: None  Other Topics Concern   Not on file  Social History Narrative   Not on file   Social Determinants of Health   Financial Resource Strain: Low Risk  (05/31/2022)   Overall Financial Resource Strain (CARDIA)    Difficulty of Paying Living Expenses: Not hard at all  Food Insecurity: No Food Insecurity (05/31/2022)   Hunger Vital Sign    Worried About Running Out of Food in the Last Year: Never true    [place] in the Last Year: Never true  Transportation Needs: No Transportation Needs (05/31/2022)   PRAPARE - Radiographer, therapeutic (Medical): No    Lack of Transportation (Non-Medical): No  Physical Activity: Inactive (05/31/2022)   Exercise Vital Sign    Days of Exercise per Week: 0 days    Minutes of Exercise per Session: 0 min  Stress: No Stress Concern Present (05/31/2022)   [place]    Feeling of Stress : Not at all  Social Connections: Moderately Integrated (05/31/2022)   Social Connection and Isolation Panel [NHANES]    Frequency of Communication with Friends and Family: Twice a week    Frequency of Social Gatherings with Friends and Family: Once a week  Attends Religious Services: More than 4 times per year    Active Member of Clubs or Organizations: No    Attends Archivist Meetings: Never    Marital Status: Living with partner      Family History  Problem Relation Age of Onset   Hypertension Mother    Healthy Mother    Healthy Father    Asthma Neg Hx    Cancer Neg Hx    Diabetes Neg Hx    Heart disease Neg Hx       ROS:   Please see the history of present illness.     All other systems reviewed and are negative.   Labs/EKG Reviewed:    EKG:   EKG is was ordered today.  The ekg ordered today demonstrates sinus rhythm, heart rate 95 bpm.  Recent Labs: 05/31/2022: Hemoglobin 12.6; Platelets 240 06/15/2022: ALT 9; BUN 13; Creatinine, Ser 0.79; Potassium 3.8; Sodium 137   Recent Lipid Panel Lab Results  Component Value Date/Time   CHOL 158 11/04/2021 11:23 AM   TRIG 39 11/04/2021 11:23 AM   HDL 73 11/04/2021 11:23 AM   CHOLHDL 2.2 11/04/2021 11:23 AM   LDLCALC 76 11/04/2021 11:23 AM    Physical Exam:    VS:  BP 132/80   Pulse 97   Ht '5\' 6"'$  (1.676 m)   Wt 79.2 kg   LMP 04/06/2022 (Approximate)   SpO2 100%   BMI 28.18 kg/m     Wt Readings from Last 3 Encounters:  10/01/22 79.2 kg  09/22/22 77.6 kg  09/22/22 76.7 kg     GEN: Well nourished, well developed in no acute  distress HEENT: Normal NECK: No JVD; No carotid bruits LYMPHATICS: No lymphadenopathy CARDIAC: RRR, 3 out of 6 holosystolic mid ejection murmurs, rubs, gallops RESPIRATORY:  Clear to auscultation without rales, wheezing or rhonchi  ABDOMEN: Soft, non-tender, non-distended MUSCULOSKELETAL:  No edema; No deformity  SKIN: Warm and dry NEUROLOGIC:  Alert and oriented x 3 PSYCHIATRIC:  Normal affect    Risk Assessment/Risk Calculators:                  ASSESSMENT & PLAN:    Chronic hypertension pregnancy  Blood pressure is acceptable, continue with current antihypertensive regimen. Continue current dose of Nifedipine  The patient is in agreement with the above plan. The patient left the office in stable condition.  The patient will follow up in 12 weeks or sooner if needed.  Patient Instructions  Medication Instructions:  Your physician recommends that you continue on your current medications as directed. Please refer to the Current Medication list given to you today.   Please take your blood pressure daily for 2 weeks and send in a MyChart message. Please include heart rates.   HOW TO TAKE YOUR BLOOD PRESSURE: Rest 5 minutes before taking your blood pressure. Don't smoke or drink caffeinated beverages for at least 30 minutes before. Take your blood pressure before (not after) you eat. Sit comfortably with your back supported and both feet on the floor (don't cross your legs). Elevate your arm to heart level on a table or a desk. Use the proper sized cuff. It should fit smoothly and snugly around your bare upper arm. There should be enough room to slip a fingertip under the cuff. The bottom edge of the cuff should be 1 inch above the crease of the elbow. Ideally, take 3 measurements at one sitting and record the average.  *If you need a  refill on your cardiac medications before your next appointment, please call your pharmacy*   Lab  Work: None  Testing/Procedures: None   Follow-Up: At [facility], you and your health needs are our priority.  As part of our continuing mission to provide you with exceptional heart care, we have created designated Provider Care Teams.  These Care Teams include your primary Cardiologist (physician) and Advanced Practice Providers (APPs -  Physician Assistants and Nurse Practitioners) who all work together to provide you with the care you need, when you need it.  We recommend signing up for the patient portal called "MyChart".  Sign up information is provided on this After Visit Summary.  MyChart is used to connect with patients for Virtual Visits (Telemedicine).  Patients are able to view lab/test results, encounter notes, upcoming appointments, etc.  Non-urgent messages can be sent to your provider as well.   To learn more about what you can do with MyChart, go to [email or web address].    Your next appointment:   10 week(s)  Provider:   Berniece Salines, DO     Other instructions: PLEASE PURCHASE AND WEAR COMPRESSION STOCKINGS DAILY AND TAKE OFF AT BEDTIME.  I recommend getting support socks/stockings. 10 mmHg is the preferred amount of compression.    Compression stockings are elastic socks that squeeze the legs. They help to increase blood flow to the legs and to decrease swelling in the legs from fluid retention, and reduce the chance of developing blood clots in the lower legs. Please put on in the AM when dressing and off at night when dressing for bed.   [employer];  [place] ([place] [phone]); [place], [place] [address]; [phone]  EMAIL:   [email or web address].   PLEASE MAKE SURE TO ELEVATE YOUR FEET & LEGS ABOVE YOUR HEART WHILE SITTING, THIS WILL HELP WITH THE SWELLING ALSO.      Dispo:  No follow-ups on file.   Medication Adjustments/Labs and Tests Ordered: Current medicines are reviewed at length with the patient today.  Concerns  regarding medicines are outlined above.  Tests Ordered: No orders of the defined types were placed in this encounter.  Medication Changes: No orders of the defined types were placed in this encounter.

## 2022-10-01 NOTE — Patient Instructions (Signed)
Medication Instructions:  Your physician recommends that you continue on your current medications as directed. Please refer to the Current Medication list given to you today.   Please take your blood pressure daily for 2 weeks and send in a MyChart message. Please include heart rates.   HOW TO TAKE YOUR BLOOD PRESSURE: Rest 5 minutes before taking your blood pressure. Don't smoke or drink caffeinated beverages for at least 30 minutes before. Take your blood pressure before (not after) you eat. Sit comfortably with your back supported and both feet on the floor (don't cross your legs). Elevate your arm to heart level on a table or a desk. Use the proper sized cuff. It should fit smoothly and snugly around your bare upper arm. There should be enough room to slip a fingertip under the cuff. The bottom edge of the cuff should be 1 inch above the crease of the elbow. Ideally, take 3 measurements at one sitting and record the average.  *If you need a refill on your cardiac medications before your next appointment, please call your pharmacy*   Lab Work: None  Testing/Procedures: None   Follow-Up: At [facility], you and your health needs are our priority.  As part of our continuing mission to provide you with exceptional heart care, we have created designated Provider Care Teams.  These Care Teams include your primary Cardiologist (physician) and Advanced Practice Providers (APPs -  Physician Assistants and Nurse Practitioners) who all work together to provide you with the care you need, when you need it.  We recommend signing up for the patient portal called "MyChart".  Sign up information is provided on this After Visit Summary.  MyChart is used to connect with patients for Virtual Visits (Telemedicine).  Patients are able to view lab/test results, encounter notes, upcoming appointments, etc.  Non-urgent messages can be sent to your provider as well.   To learn more about what you  can do with MyChart, go to [email or web address].    Your next appointment:   10 week(s)  Provider:   Berniece Salines, DO     Other instructions: PLEASE PURCHASE AND WEAR COMPRESSION STOCKINGS DAILY AND TAKE OFF AT BEDTIME.  I recommend getting support socks/stockings. 10 mmHg is the preferred amount of compression.    Compression stockings are elastic socks that squeeze the legs. They help to increase blood flow to the legs and to decrease swelling in the legs from fluid retention, and reduce the chance of developing blood clots in the lower legs. Please put on in the AM when dressing and off at night when dressing for bed.   [employer];  [place] ([place] [phone]); [place], [place] [address]; [phone]  EMAIL:   [email or web address].   PLEASE MAKE SURE TO ELEVATE YOUR FEET & LEGS ABOVE YOUR HEART WHILE SITTING, THIS WILL HELP WITH THE SWELLING ALSO.

## 2022-10-11 ENCOUNTER — Ambulatory Visit (INDEPENDENT_AMBULATORY_CARE_PROVIDER_SITE_OTHER): Payer: No Typology Code available for payment source | Admitting: Obstetrics & Gynecology

## 2022-10-11 ENCOUNTER — Telehealth: Payer: Self-pay

## 2022-10-11 VITALS — BP 132/82 | HR 88 | Wt 178.8 lb

## 2022-10-11 DIAGNOSIS — Z23 Encounter for immunization: Secondary | ICD-10-CM | POA: Diagnosis not present

## 2022-10-11 DIAGNOSIS — Z3402 Encounter for supervision of normal first pregnancy, second trimester: Secondary | ICD-10-CM

## 2022-10-11 DIAGNOSIS — Z3A26 26 weeks gestation of pregnancy: Secondary | ICD-10-CM

## 2022-10-11 DIAGNOSIS — R87612 Low grade squamous intraepithelial lesion on cytologic smear of cervix (LGSIL): Secondary | ICD-10-CM

## 2022-10-11 DIAGNOSIS — O10919 Unspecified pre-existing hypertension complicating pregnancy, unspecified trimester: Secondary | ICD-10-CM

## 2022-10-11 DIAGNOSIS — O0990 Supervision of high risk pregnancy, unspecified, unspecified trimester: Secondary | ICD-10-CM

## 2022-10-11 DIAGNOSIS — D563 Thalassemia minor: Secondary | ICD-10-CM

## 2022-10-11 NOTE — Progress Notes (Signed)
PRENATAL VISIT NOTE  Subjective:  Diana Robinson is a 22 y.o. G1P0000 at 33w6dbeing seen today for ongoing prenatal care.  She is currently monitored for the following issues for this high-risk pregnancy and has Frequent headaches; Supervision of high risk pregnancy, antepartum; Murmur; Chronic hypertension in pregnancy; LGSIL on Pap smear of cervix; Alpha thalassemia silent carrier; Neck pain, acute; and Encounter for examination following treatment at hospital on their problem list.  Patient reports no complaints.  Contractions: Not present. Vag. Bleeding: None.  Movement: Present. Denies leaking of fluid.   The following portions of the patient's history were reviewed and updated as appropriate: allergies, current medications, past family history, past medical history, past social history, past surgical history and problem list.   Objective:   Vitals:   10/11/22 1015  BP: 132/82  Pulse: 88  Weight: 178 lb 12.8 oz (81.1 kg)    Fetal Status: Fetal Heart Rate (bpm): 144   Movement: Present     General:  Alert, oriented and cooperative. Patient is in no acute distress.  Skin: Skin is warm and dry. No rash noted.   Cardiovascular: Normal heart rate noted  Respiratory: Normal respiratory effort, no problems with respiration noted  Abdomen: Soft, gravid, appropriate for gestational age.  Pain/Pressure: Absent     Pelvic: Cervical exam deferred        Extremities: Normal range of motion.  Edema: None  Mental Status: Normal mood and affect. Normal behavior. Normal judgment and thought content.   Assessment and Plan:  Pregnancy: G1P0000 at [redacted]w[redacted]d. Supervision of high risk pregnancy, antepartum - on PNV and baby AsA  2. Chronic hypertension in pregnancy - on procardia 60 XL daily - has q 4 week growth scans scheduled and weekly BPPs starting at 32 weeks scheduled  3. Encounter for supervision of normal first pregnancy in second trimester - CBC - Glucose Tolerance, 2 Hours w/1  Hour - HIV Antibody (routine testing w rflx) - RPR - tdap updated today  4. [redacted] weeks gestation of pregnancy  5. LGSIL on Pap smear of cervix - will repeat PP  6. Alpha thalassemia silent carrier - FOB has not done  Preterm labor symptoms and general obstetric precautions including but not limited to vaginal bleeding, contractions, leaking of fluid and fetal movement were reviewed in detail with the patient. Please refer to After Visit Summary for other counseling recommendations.   Return in about 4 weeks (around 11/08/2022) for Office ob visit (MD or APP).  Future Appointments  Date Time Provider [place]/07/20[place]  3:30 PM [facility] NURSE [facility]20[facility]  3:45 PM [facility]5 [facility]/04/2023  3:30 PM [facility]URSE [facility]04/2023  3:45 PM [facility] US[facility]05/2023 10:15 AM MiMegan SalonMD DWB-OBGYN [facility]  11/23/2022  3:30 PM [facility]URSE [facility]4/16/2024  3:45 PM [facility] US5 [facility]4/23/2024  3:30 PM [facility] NURSE [facility]4/23/2024  3:45 PM [facility] US[facility]4/25/2024 11:15 AM MiMegan SalonMD [facility]-OBGYN [facility]  12/07/2022  3:30 PM [facility] NURSE [facility]4/30/2024  3:45 PM [facility] US5 [facility]5/04/2023 10:15 AM MiMegan SalonMD [facility]-OBGYN [facility]  12/17/2022  8:40 AM ToBerniece SalinesDO [facility] None  12/23/2022 10:35 AM MiMegan SalonMD [facility]OBGYN [facility]  12/30/2022 11:15 AM MiMegan SalonMD [facility]OBGYN [facility]  01/06/2023 10:35 AM MiMegan SalonMD [facility]OB[facility]  01/13/2023 10:15  AM Megan Salon, MD [facility]  06/23/2023  3:00 PM Fenton Foy, NP [facility] None    Megan Salon, MD

## 2022-10-12 LAB — RPR: RPR Ser Ql: NONREACTIVE

## 2022-10-12 LAB — CBC
Hematocrit: 29.9 % — ABNORMAL LOW (ref 34.0–46.6)
Hemoglobin: 10 g/dL — ABNORMAL LOW (ref 11.1–15.9)
MCH: 27.7 pg (ref 26.6–33.0)
MCHC: 33.4 g/dL (ref 31.5–35.7)
MCV: 83 fL (ref 79–97)
Platelets: 199 10*3/uL (ref 150–450)
RBC: 3.61 x10E6/uL — ABNORMAL LOW (ref 3.77–5.28)
RDW: 12.1 % (ref 11.7–15.4)
WBC: 7.2 10*3/uL (ref 3.4–10.8)

## 2022-10-12 LAB — GLUCOSE TOLERANCE, 2 HOURS W/ 1HR
Glucose, 1 hour: 125 mg/dL (ref 70–179)
Glucose, 2 hour: 114 mg/dL (ref 70–152)
Glucose, Fasting: 91 mg/dL (ref 70–91)

## 2022-10-12 LAB — HIV ANTIBODY (ROUTINE TESTING W REFLEX): HIV Screen 4th Generation wRfx: NONREACTIVE

## 2022-10-13 ENCOUNTER — Encounter (HOSPITAL_BASED_OUTPATIENT_CLINIC_OR_DEPARTMENT_OTHER): Payer: Self-pay | Admitting: Obstetrics & Gynecology

## 2022-10-13 DIAGNOSIS — O99013 Anemia complicating pregnancy, third trimester: Secondary | ICD-10-CM | POA: Insufficient documentation

## 2022-10-19 ENCOUNTER — Ambulatory Visit: Payer: No Typology Code available for payment source | Attending: Maternal & Fetal Medicine

## 2022-10-19 ENCOUNTER — Ambulatory Visit: Payer: No Typology Code available for payment source | Admitting: *Deleted

## 2022-10-19 ENCOUNTER — Encounter: Payer: Self-pay | Admitting: *Deleted

## 2022-10-19 VITALS — BP 133/72 | HR 91

## 2022-10-19 DIAGNOSIS — O0990 Supervision of high risk pregnancy, unspecified, unspecified trimester: Secondary | ICD-10-CM | POA: Diagnosis present

## 2022-10-19 DIAGNOSIS — O285 Abnormal chromosomal and genetic finding on antenatal screening of mother: Secondary | ICD-10-CM | POA: Diagnosis not present

## 2022-10-19 DIAGNOSIS — O10013 Pre-existing essential hypertension complicating pregnancy, third trimester: Secondary | ICD-10-CM | POA: Diagnosis not present

## 2022-10-19 DIAGNOSIS — D259 Leiomyoma of uterus, unspecified: Secondary | ICD-10-CM | POA: Insufficient documentation

## 2022-10-19 DIAGNOSIS — O10919 Unspecified pre-existing hypertension complicating pregnancy, unspecified trimester: Secondary | ICD-10-CM | POA: Diagnosis not present

## 2022-10-19 DIAGNOSIS — O3413 Maternal care for benign tumor of corpus uteri, third trimester: Secondary | ICD-10-CM | POA: Diagnosis present

## 2022-10-19 DIAGNOSIS — D563 Thalassemia minor: Secondary | ICD-10-CM

## 2022-10-19 DIAGNOSIS — Z3A28 28 weeks gestation of pregnancy: Secondary | ICD-10-CM

## 2022-10-20 ENCOUNTER — Other Ambulatory Visit: Payer: Self-pay | Admitting: *Deleted

## 2022-10-20 DIAGNOSIS — O10913 Unspecified pre-existing hypertension complicating pregnancy, third trimester: Secondary | ICD-10-CM

## 2022-10-20 NOTE — Telephone Encounter (Signed)
Called pt unable to leave a message. [place]

## 2022-11-06 ENCOUNTER — Encounter (HOSPITAL_BASED_OUTPATIENT_CLINIC_OR_DEPARTMENT_OTHER): Payer: Self-pay | Admitting: Obstetrics & Gynecology

## 2022-11-06 ENCOUNTER — Other Ambulatory Visit (HOSPITAL_BASED_OUTPATIENT_CLINIC_OR_DEPARTMENT_OTHER): Payer: Self-pay | Admitting: Obstetrics & Gynecology

## 2022-11-06 DIAGNOSIS — K219 Gastro-esophageal reflux disease without esophagitis: Secondary | ICD-10-CM

## 2022-11-06 DIAGNOSIS — O0990 Supervision of high risk pregnancy, unspecified, unspecified trimester: Secondary | ICD-10-CM

## 2022-11-06 MED ORDER — OMEPRAZOLE 40 MG PO CPDR: 40.0000 mg | DELAYED_RELEASE_CAPSULE | Freq: Every day | ORAL | 2 refills | Status: DC

## 2022-11-08 NOTE — Addendum Note (Signed)
Addended by: Blenda Nicely on: 11/08/2022 11:36 AM   Modules accepted: Orders

## 2022-11-16 ENCOUNTER — Ambulatory Visit: Payer: No Typology Code available for payment source

## 2022-11-17 ENCOUNTER — Ambulatory Visit: Payer: No Typology Code available for payment source | Attending: Maternal & Fetal Medicine

## 2022-11-17 ENCOUNTER — Ambulatory Visit: Payer: No Typology Code available for payment source | Admitting: *Deleted

## 2022-11-17 ENCOUNTER — Encounter (HOSPITAL_BASED_OUTPATIENT_CLINIC_OR_DEPARTMENT_OTHER): Payer: Medicaid Other | Admitting: Obstetrics & Gynecology

## 2022-11-17 VITALS — BP 135/80 | HR 90

## 2022-11-17 DIAGNOSIS — O10919 Unspecified pre-existing hypertension complicating pregnancy, unspecified trimester: Secondary | ICD-10-CM | POA: Diagnosis present

## 2022-11-17 DIAGNOSIS — O0990 Supervision of high risk pregnancy, unspecified, unspecified trimester: Secondary | ICD-10-CM | POA: Insufficient documentation

## 2022-11-17 DIAGNOSIS — D259 Leiomyoma of uterus, unspecified: Secondary | ICD-10-CM | POA: Insufficient documentation

## 2022-11-17 DIAGNOSIS — D563 Thalassemia minor: Secondary | ICD-10-CM

## 2022-11-17 DIAGNOSIS — O3413 Maternal care for benign tumor of corpus uteri, third trimester: Secondary | ICD-10-CM | POA: Diagnosis present

## 2022-11-17 DIAGNOSIS — Z3A32 32 weeks gestation of pregnancy: Secondary | ICD-10-CM | POA: Diagnosis not present

## 2022-11-17 DIAGNOSIS — O285 Abnormal chromosomal and genetic finding on antenatal screening of mother: Secondary | ICD-10-CM

## 2022-11-17 DIAGNOSIS — O10013 Pre-existing essential hypertension complicating pregnancy, third trimester: Secondary | ICD-10-CM | POA: Diagnosis not present

## 2022-11-18 ENCOUNTER — Ambulatory Visit (HOSPITAL_BASED_OUTPATIENT_CLINIC_OR_DEPARTMENT_OTHER): Payer: No Typology Code available for payment source | Admitting: Obstetrics & Gynecology

## 2022-11-18 VITALS — BP 128/82 | HR 90 | Wt 188.4 lb

## 2022-11-18 DIAGNOSIS — O99013 Anemia complicating pregnancy, third trimester: Secondary | ICD-10-CM

## 2022-11-18 DIAGNOSIS — D563 Thalassemia minor: Secondary | ICD-10-CM

## 2022-11-18 DIAGNOSIS — R87612 Low grade squamous intraepithelial lesion on cytologic smear of cervix (LGSIL): Secondary | ICD-10-CM

## 2022-11-18 DIAGNOSIS — O10919 Unspecified pre-existing hypertension complicating pregnancy, unspecified trimester: Secondary | ICD-10-CM

## 2022-11-18 DIAGNOSIS — O0990 Supervision of high risk pregnancy, unspecified, unspecified trimester: Secondary | ICD-10-CM

## 2022-11-18 DIAGNOSIS — Z3A32 32 weeks gestation of pregnancy: Secondary | ICD-10-CM

## 2022-11-18 NOTE — Patient Instructions (Signed)
[place] [facility] Providers  [place] ([address]) [facility] Brown, MD; Chambliss, MD; Eniola, MD; Hensel, MD; McDiarmid, MD; McIntyer, MD [address] [place], [place] [address] [phone] Mon-Fri 8:30-12:30, 1:30-5:00  Providers come to see babies during newborn hospitalization Only accepting infants of Mother's who are seen at Family Medicine Center or have siblings seen at   [facility] Center Medicaid - Yes; Tricare - Yes   [facility] Mulberry, MD [address] [place], [place] [address] [phone] Mon, Tue, Thur, Fri 8:30-5:00, Wed 10:00-7:00 (closed 1-2pm daily for lunch) Takes [place] residents with no insurance.  [place] [facility] only with Medicaid/insurance; Tricare - no   [facility] [place] [facility] Ben-Davies, MD; Brown, MD; Chandler, MD; Ettefagh, MD; Grant, MD; Hanvey, MD; Herrin, MD; Jones,  MD; Lester, MD; McCormick, MD; McQueen, MD; Simha, MD; Stanley, MD; Stryffeler, NP [address], [place], [place] [address] [phone] Mon, Tue, Thur, Fri 8:30-5:30, Wed 9:30-5:30, Sat 8:30-12:30 Only accepting infants of first-time parents or siblings of current patients Hospital discharge coordinator will make follow-up appointment Medicaid - yes; Tricare - yes  [place] ([address]) [facility] Cox, MD; Davis, MD; Dovico, MD; Ettefaugh, MD; Lowe, MD; Nation, MD; Slimp, MD; Sumner, MD; Williams, MD [address], [place], [place] [address] [phone] Mon-Fri 8:30-5:00, closed for lunch 12:30-1:30; Sat-Sun 10:00-1:00 Accepting Newborns with commercial insurance only, must call prior to delivery to be accepted into  practice.  Medicaid - no, Tricare - yes   [facility] [address] [place], [place] [address] [phone] or [phone] Mon to Fri 8am to 10pm, Sat 8am to 1pm  (virtual only on weekends) Only accepts Medicaid Healthy Blue pts  [facility]  Artis, MD; Coccaro, MD; Lockett Gardner, MD; Netherton, NP; Roper, MD; Wilmot, PA-C; Skinner, MD [address] [place], [place] [address] [phone] Mon-Fri 8:30-5:30 Medicaid - yes, Tricare - yes  [place] ([address]) [facility] Warner, MD [address], [place], [place] [address] [phone] Mon, Tues, Wed Fri 8:30-5:00, Sat 8:30-12:00, Closed Thursdays Accepting siblings of established patients and first time mom's if you call prenatally Medicaid- yes; Tricare - yes  [facility] Becker, PA; Hagler, MD; Quinn, PA-C; Scifres, PA; Sun, MD; Swayne, MD;  [address], [place], [place] [address] [phone] Mon-Fri 8:30-5:00, closed for lunch 1-2 Only accepting newborns of established patients Medicaid- no; Tricare - yes  [place] ([address]) [facility] Timberlake, MD; [address], [place], [place] [address] [phone] Mon-Fri 8:00-5:00 Medicaid - No; Tricare - Yes  [facility] [employer]  Brake, NP; Wharton, PA [address], [place], [place] [address] [phone] Mon-Fri 8:00-5:00 Medicaid - No, Tricare - Yes  [facility] Gay, MD; Quinlan, MD; Blatt, DNP [address] [place], [place] [address] [phone]  Mon-Fri 8:00-5:00 Medicaid - No; Tricare - Yes  [facility] [address] [place], [place] [address] [phone] Mon-Fri 8:30-5:00 (lunch 12:00-1:00) Medicaid -Yes; Tricare - Yes  [facility] Jordan, MD [address], [place], [place] [address] [phone] Mon-Fri 8:00-5:00 Seeing newborns of current patients only. No new patients Medicaid - No, Tricare - yes  [facility] Parker, MD [address] ,  [address] [phone] Mon-Fri 8:00-5:00 Medicaid -yes as secondary coverage only;   Tricare - yes  [facility] Brecken, PA; Christy, NP; Dees, MD; DeClaire, MD; DeWeese, MD; Hodge, PA; Smoot, NP; Summer, MD; Vapne, MD [address] [place], [place] [address] [phone] Mon-Fri 8:30-5:00, Sat 9:00-11:00 Accepts commercial insurance ONLY. Offers free prenatal information sessions for families. Medicaid - No, Tricare - Call first  [facility] Bouska, MD; Gordon, PA; Jeffery, PA; Weber, PA [address] [place] [address] [phone] Mon-Fri 7:30-5:30 Medicaid - Yes; Tricare - yes  [place] ([address] & [address])  [facility] Reese, MD [address] [place], [place] [address] [phone] Mon-Thur 8:00-6:00, closed for lunch 12-2, closed Fridays Medicaid - yes; Tricare - no  [facility] Anderson, NP; Badger, MD; Beal, PA; Spencer, PA [address], [place], [place] [address] [phone] Mon-Fri 7:30-4:30 Medicaid - yes, Tricare - yes  [facility]  Agbuya, MD; Klett, NP; Romgoolam, MD; Rothstein, NP [address], [place], [place] [address] [phone] Mon-Fri 8:30-5:00, closed for lunch 1-2, Sat 8:30-12:00 - sick visits only Providers come to see babies at [facility] Only accepting newborns of siblings and first time parents ONLY if who have met with office prior to delivery Medicaid -Yes; Tricare - yes  [facility] [place]  Golden, DO; Friddle, NP; Wallace, MD; Wood, MD:  [address], [place], [place] [address] [phone] Mon- Fri 8:00-5:00, Sat 9:00-12:00 - sick visits only Accepting siblings of established patients and first time mom/baby Medicaid - Yes; Tricare - yes Patients must have vaccinations (baby vaccines)  [place] ([address] &  [address])  [facility] [address] [place], [place] [address] [phone] Mon-Fri 8:00-5:00 Medicaid - no; Tricare - yes  [facility] Briscoe, MD; Schmidt, PA; Moreira, PA [address], [place], [place] [address] [phone] Mon-Fri 8:00-5:00 Medicaid- yes; Tricare - yes  [facility] Boyd, MD; Jones, NP; Osborn, PA [address], [place], [place] [address] [phone] Mon-Fri 8:00-5:00 Medicaid - Yes; Tricare - yes  [place] ([address])  [facility] Atkinson, PA; Calderon, PA; Cummings, MD; Dillard, MD; Henrish, NP; Isenhour, DO; Martin, PA; Olson, MD; Ott, MD; Phillips, MD; Valente, PA; VanDeven, PA; Yonjof, NP [address], [place], [place] [address] [phone] Mon-Fri 8:30-5:00, Sat 9:00-12:00 - sick only Please register online [email or web address] then schedule online or call office Medicaid-Yes; Tricare -yes  [facility]  Dabrusco, MD; Dial, MD; Truxton, MD; Fleenor, NP; Goolsby, PA; Tonuzi, MD; Turner, NP; West, MD [address], [place], [place] [address] [phone] Mon-Fri 8:00-5:30, Sat&Sun by appointment (phones open at 8:30) Medicaid - Yes; Tricare - yes  [place] ([address]) [facility] Allen, CPNP; Bates, MD; Gordon, MD; Mills, NP; Weinshilboum, DO [address], [place], [place] [address] [phone] M-F 8:00 - 5:15, Sat/Sun 9-12 sick visits only Medicaid - No; Tricare - yes  [facility] [place] [facility]  Brown, PA-C; Cowen, PA-C; Dennis, DO; Fuster, PA-C; Martin, PA-C; Shelton, PA-C; Spry, MD [address] [place], [place] [address] [phone] Mon-Thur 8:00-7:00, Fri 8:00-5:00 Accepting Medicaid for 13 and under only   [facility] [place] [facility] [place] Hayes, FNP; List, FNP; Moran, MD; Pitonzo, PA-C; Scholer,   MD; Spangle, FNP; Nzenwa, FNP; Jasper, MD; Moran, MD [address] [place], [place] [address] [phone] Mon-Fri 8:30-5:30 Medicaid - Yes; Tricare - yes  [facility]  Kelly, CPNP; Logan, MD; Poth, MD; Ramadoss, MD; Staton, NP [address], [place], [place] [address] [phone] Mon-Thur 8:00-5:30, Fri 8:00-5:00, Sat 9:00-12:00 Medicaid - yes, Tricare - yes  [place] ([address])  [facility] [place] Masneri, DO; Meyers, MD; Nelson, PA [address], [place], [place] [address] [phone] Mon-Fri 8:00-5:00, closed for lunch 12-1 Medicaid - No; Tricare - yes  [facility] [place] McGowen, MD [address], [place], [place] [address] [phone] Mon-Fri 8:00-5:00 Medicaid - No; Tricare - yes  [facility] [place] MacDonald, MD; Nayak, MD; Kearns, MD; Jones, MD [address] [place] [address], [place], [place] [address] [phone] Mon-Fri 8:00-5:00 Medicaid- Yes; Tricare - yes  [place] ([address])  [facility] [place] Martin, PA-C; Tabori, MD [address], [place], [place] [address] [phone] Mon-Fri 8:00-5:00 Medicaid - No; Tricare - yes  [facility] [place]  Margin - CPNP [address], [place], [place] [address] [phone] Mon-Weds 8:00-6:00, Thurs-Fri 8:00-5:00, Sat 9:00-12:00 Medicaid - yes; Tricare - yes   [facility] [place] Aubuchon, MD; Brandon, PA [address] [place], [place] [address] [phone] Mon-Fri 8:00-5:00 Medicaid - yes; Tricare - yes  [place] [facility] Providers  [facility] [address], [place], [place] [address] [phone] M, Thur: 8am -8pm, Tues, Weds: 8am - 5pm; Fri: 8-1 Medicaid - Yes; Tricare -  yes  [facility] Mertz, MD; Johnson, MD; Wells, MD; Downs, PA; Hockenberger, PA [address], [place], [place] [address] [phone] M-F 8:30 - 5:00 Medicaid - Call office; Tricare -yes  [facility] Bonney, MD; Page, MD, Minter, MD; Mueller, PNP; Thomason, NP [address], [place], [place] [address] [phone] M-F 8:30 - 5:00, Sat/Sun 8:30 - 12:30 (sick visits) Medicaid - Call office; Tricare -yes  [facility] Lewis, MD; Shaub, PNP; Boylston, MD; Quaile, PA; Nonato, NP; Landon, CPNP [address], [place], [place] [address] [phone] M-F 8:30 - 5:00 Medicaid - Call office; Tricare - yes  [facility] [place] Cline, MD; Dvergsten, MD; Flores, MD; Kawatu, MD; Nogo, MD [address], [place], [place] [address] [phone] M-Thur: 8:00 - 5:00; Fri: 8:00 - 4:00 Medicaid - yes; Tricare - yes  [facility] [address] [place], [place], [place] [address] [phone] M-F: 8:30- 5:00, closed for lunch 12:30 - 1:00 Medicaid - yes; Tricare -yes  [facility] [place] [address], [place], [place] [address] [phone] M-F 8:00 - 5:00 Medicaid - yes; Tricare - yes  [facility] Johnson, DO; Rumball, DO; Wicker, NP [address] [place], [place],  [address] [phone] M-F 8:00 - 5:00, Closed 12-1 for lunch Medicaid - Call; Tricare - yes  [facility] Stein, MD [address], [place],  [address] [phone] M-F: 8:00-5:00, Sat: 8:00 - noon Medicaid - call; Tricare -yes  [place] [facility] Providers  [facility] Collins, FNP-C [address], [place],  [address] [phone] M-W: 8:00-5:00, Thur: 8:00 - 7:00, Fri: 8:00 - noon Medicaid - yes; Tricare - yes  [facility] [place] Adams, FNP [address], [place],   [place] [address] [phone] M-F 8:00 - 5:00, Closed for lunch 12-1 Medicaid -  yes; Tricare - yes  [place] [facility] Providers  [facility] Smith, FNP, Melvin, MD, Fay, FNP-C [address] [place] [address] [place], [place], [place] [address] [phone] M-T 8:00-5:00, Wed-Fri 7:00-6:00 Medicaid - Yes; Tricare -yes  [facility] [place] Civiletti, DO; [address], [place], [place] [address] [phone] M-F 8:00 - 5:00, closed for lunch 12-1 Medicaid - Yes; Tricare - yes  [facility]  Barnes, MD; Bergdolt, MD; Caul[place], MD; Emrich, MD; Fiscus, MD; Hoppens, MD; Kylstra, MD, McPherson, MD; Todd, MD; Prestwood, MD; Waters, MD; Wood, MD [address], [place], [place] [address] [phone] M-F 8:00-5:00 Medicaid - yes; Tricare - yes  [facility] Cheema, MD (speaks Punjabi and Hindi) [address] [place], [place] [address] [phone] M-F: 8:30 - 5:00, closed 12:30 - 1 for lunch Medicaid - Yes; Tricare -yes  [place] [facility] Providers  [facility] Loda, MD; Timberlake, MD; Burke, MD [address], [place], [place] [address] [phone] M-Th: 8:00 - 5:30, Fri: 8:00 - 12:00 Medicaid - yes; Tricare - yes  [facility] [place] Lookabill, NP; Meier, MD; Daffron, MD [address], [place], [place] [address] [phone] M-F: 8:00 - 5:00 Medicaid - yes; Tricare - yes  [facility] Busse, NP; Bowman, NP; Baune, NP; Entwistle, MD; Williams, MD, Huffman, NP, Ferguson, MD; Patel, DO [address], [place], [place] [address] [phone] M-F: 8:30 - 5:30p Medicaid - yes; Tricare - yes Other locations available as well  [facility] Rajan, MD; Wilson, MD; Morgan, PA-C, Domenech, PA-C; Myers, PA-C [address], [place], [place] [address] [phone] M-W: 8:00am - 7:00pm, Thurs: 8:00am - 8:00pm; Fri: 8:00am -  5:00pm, closed daily from 12-1 for lunch Medicaid - yes; Tricare - yes  [place] [facility] Providers  [facility] [place] Adams, MD; Crystal, FNP; Hadley, MD; Stokes, MD; Johnson, PNP; Brady, PA-C; West, PNP; Gardner, MD;  [address], [place], [place] [address] [phone] M - Fri: 8am - 5pm, Sat 9-noon Medicaid - Yes; Tricare -yes  [facility] [place] Nayak, MD; Jones, FNP; McDonald, MD; Kearns, MD [address] [place] [address], [place], [place] [phone] M-F 8:00 - 5:00 Medicaid - call; Tricare - yes  [facility] [place] Bell, MD; Emory, PNP; Pinder, MD; Anderson, MD; Light, PA-C; Johnson, MD; Latta, MD; Saul, PNP; Rainey, MD; Clifford, MD; McClung, MD [address], [place], [place] [address] [phone] M-F 8:00am - 5:00pm; Sat. 9:00 - 11:00 Medicaid - yes; Tricare - yes  [facility] [place] Soldato-Couture, MD [address], [place], [place] [address] [phone] M-F 8:00 - 5:00 Medicaid - [place] Medicaid only; Tricare - yes  [facility] [place] Walker, MD; Davis, PNP; Ajizian, MD [address] [place] [address], [place], [place] [address] [phone] M-F 8:00 - 5:00 Medicaid - yes; Tricare - yes  [facility] [place] Barry, MD; Brown, MD, Forest, MD, Hazek, MD; Hoyle, MD; Smith, MD; [address] [place], [place], [place] [address] [phone] M-F: 8-5 Medicaid - yes; Tricare - yes  [facility] [place] Brady, Md; Dowlen, MD; [address] [place] [address], [place],  [address] [phone] M-F 8-5 Medicaid - yes; Tricare - yes  [facility] Kearns, MD; Nayak, MD;   [place]-Court[place], MD; Pellam-[place], DNP; Herring, PNP [address], [place], [place] [address] [phone] M-F 8-5 Medicaid - yes; Tricare - yes  [facility] Weathers, MD;  Merritt, PA-C; Davis-PA-C; Warnimont, MD [address], [place], [place] [address] [phone] M-F 7am - 5 pm Medicaid - call; Tricare - yes  [facility] Hill, PNP; Erickson, MD; Robinson, MD [address], [place], [place] [address] [phone] M-F 8-5 Medicaid - yes; Tricare - yes  [facility] Kribbs, MD; Warner, MD; Williams, FNP; Brooks, FNP; Boles, FNP; Romblad, PA-C; [place] - FNP [address], [place], [place] [address] [phone] M-F 8-5 Medicaid- yes; Tricare - yes  [facility] [place] [place] [place] [facility] [place] Yoder, MD; Verenes, MD; Armentrout, MD; Stewart, MD; Beasley, CPNP; Ford, MD; Erickson, MD; [place], MD [address], [place], [place] [address] [phone] M-F: 8-5, Sat: 9-4, Sun 9-12 Medicaid - yes; Tricare - yes  [facility] Little, PNP; Davis, PNP [address], [place], [place] [address] [phone] M-F 8 - 5, closed 12-1 for lunch Medicaid - yes; Tricare - yes  [facility] Friesen, MD; Cnegia, MD; [place], MD; Patel, DO [address], [place], [place] [address] [phone] M-F 8- 5:30 Medicaid - yes; Tricare - yes  [facility] [place] Zvolensky, MD; Ray, MD; Haas, MD [address] [address], [place], [place] [address] [phone] M: 8-7; Tues-Fri: 8-5; Sat: 9-12 Medicaid - yes; Tricare - yes  [facility] [place] Heinrich, MD; Meyer, MD; Clark, MD; Rhyne, MD; Aubuchon, MD [address], [place], [place] [address] [phone] M: 8-7; Tues-Fri: 8-5; Sat: 8:30-12:30 Medicaid - yes; Tricare - yes  [facility] [place] Bista, MD; Dillard, PA [address], [place], [place] [address] [phone] Mon-Fri: 8-5 Medicaid - yes;  Tricare - yes  [facility] [place] Beasley, CPNP; Mahle, CPNP; [place], MD; Duffy, MD; Culler, MD; [address], [place], [place] [address] [phone] M-F: 8-5, closed 1-2 for lunch Medicaid - yes; Tricare - yes  [facility] Rickman, PA; Mounce, NP; Smith, MD; Jordan, CPNP; Darty, PA; Ball, MD; Wallace, MD [address] [place] [address], [place], [place] [address] [phone] M-Thurs: 8-7; Fri: 8-6; Sat: 9-12; Sun 2-4 Medicaid - yes; Tricare - yes  [facility] Brown, MD; Shin, MD; Goodman, DNP, FNP; Sebesta, DO; [address], [place], [place] [address] [phone] M-F: 8-5 Medicaid - yes; Tricare - yes  [place] [facility] Providers  [facility] Dough, MD; Welsh, NP [address], [place], [place] [address] [phone] M - Fri: 8am - 5pm, closed for lunch 12-1 Medicaid - Yes; Tricare - yes  [facility] Manandhar, MD; Riley, MD; Sanger, DO; Vinocur, MD;Hall, PA; Walsh, PA; Campbell, NP [address], [place] [address] [phone] M-F 8:00 - 5:00, Sat 8:00 - 11:30 Medicaid - yes; Tricare - yes  [facility] Khan, MD; Redding, MD, Street, MD, Holt, MD, Burgart, MD; Rhyne, NP; Dickinson, PA;  [address], [place], [place] [address] [phone] M-F 8:10am - 5:00pm Medicaid - yes; Tricare - yes    [facility] Connors, MD; Kime, NP [address], [place], [place] [address] [phone] M-F 8:00 - 5:00 Medicaid - [place] Medicaid only; Tricare - yes  [facility] [place] Whyte, MD; Fox, NP [address], [place], [place] [address] [phone] M-F 8:00 - 5:00; Closed for lunch 12 - 1:00 Medicaid - yes;  Tricare - yes  [facility] Penner, MD; Wilburn, FNP [address], [place], [place] [address] [phone] Mon 9-5; Tues/Wed 10-5; Thurs 8:30-5; Fri: 8-12:30 Medicaid - yes; Tricare - yes  [place] [facility]iders  [facility]  Golding, MD; Jackson, PA-C [address], [place], [place] [address] [phone] phone [phone] fax M-F 7:15 - 4:30 Medicaid - yes; Tricare - yes  [facility] Gosrani, MD; Meccariello, DO [address] [place], [place] [address] [phone] M-Fri: 8:30 - 5:00, closed for lunch everyday noon - 1pm Medicaid - Yes; Tricare - yes  [facility] Burdine, MD; Daniel, MD; Howard, MD; Sasser, MD; Boles, PA; Boyd, PA-C; Carroll, PA; McGee, PA; Skillman, PA; Wilson, PA [address] [place], [place] [address] [phone] M-Thurs: 7:30am - 7:00pm; Friday 7:30am - 4pm; Sat: 8:00 - 1:00 Medicaid - Yes; Tricare - yes  [facility] [place] Akhbari, MD; Law, MD; Qayumi, MD; Salvador, DO [address], [place], [place] [address] [phone] M-Thur: 8:00 - 5:00, Fri: 8:00 - Noon Medicaid - yes; Tricare - yes No [place] [employer]   [facility] [place] [facility] Dettinger, MD; Gottschalk, DO; Hawks, NP; Martin, NP; Morgan, NP; Milian, NP; Rakes, NP; Stacks, MD; Webster, PA [address], [place], [place] [address] [phone] M-F 8:00 - 5:00 Medicaid - yes; Tricare - yes  [facility] Collins, FNP-C; Bucio, FNP-C [address], [place], [place] [address] [phone] M, W, R 8:00-5:00, Tues: 8:00am - 7:00pm; Fri 8:00 - noon Medicaid - Yes; Tricare - yes  [facility] Khan, MD [address] [place] [address] [place], [place] [address] [phone]  M-Thurs 8:30-5:30, Fri: 8:30-12:30pm Medicaid - Yes; Tricare - N  

## 2022-11-21 MED ORDER — FERROUS SULFATE 325 (65 FE) MG PO TABS: 325.0000 mg | ORAL_TABLET | Freq: Every day | ORAL | 3 refills | Status: DC

## 2022-11-21 NOTE — Progress Notes (Signed)
PRENATAL VISIT NOTE  Subjective:  Diana Robinson is a 22 y.o. G1P0000 at [redacted]w[redacted]d being seen today for ongoing prenatal care.  She is currently monitored for the following issues for this high-risk pregnancy and has Frequent headaches; Supervision of high risk pregnancy, antepartum; Murmur; Chronic hypertension in pregnancy; LGSIL on Pap smear of cervix; Alpha thalassemia silent carrier; Neck pain, acute; and Anemia in pregnancy, third trimester on their problem list.  Patient reports no complaints.  Contractions: Not present. Vag. Bleeding: None.  Movement: Present. Denies leaking of fluid.   The following portions of the patient's history were reviewed and updated as appropriate: allergies, current medications, past family history, past medical history, past social history, past surgical history and problem list.   Objective:   Vitals:   11/18/22 1601  BP: 128/82  Pulse: 90  Weight: 188 lb 6.4 oz (85.5 kg)    Fetal Status: Fetal Heart Rate (bpm): 145 Fundal Height: 33 cm Movement: Present  Presentation: Vertex  General:  Alert, oriented and cooperative. Patient is in no acute distress.  Skin: Skin is warm and dry. No rash noted.   Cardiovascular: Normal heart rate noted  Respiratory: Normal respiratory effort, no problems with respiration noted  Abdomen: Soft, gravid, appropriate for gestational age.  Pain/Pressure: Present     Pelvic: Cervical exam deferred        Extremities: Normal range of motion.  Edema: None  Mental Status: Normal mood and affect. Normal behavior. Normal judgment and thought content.   Assessment and Plan:  Pregnancy: G1P0000 at [redacted]w[redacted]d 1. Supervision of high risk pregnancy, antepartum - on PNV and baby ASA - recheck 2 weeks  2. [redacted] weeks gestation of pregnancy - have discussed delivery timing and are planning, right now, between 37 - 38 weeks  3. LGSIL on Pap smear of cervix - will repeat post partum  4. Chronic hypertension in pregnancy - on procardia  XL - having q 4 weeks growth scans - having weekly BPPs, most recently 4/10  5. Alpha thalassemia silent carrier - FOB never returned kit so testing not done  6. Anemia in pregnancy, third trimester - taking iron  Preterm labor symptoms and general obstetric precautions including but not limited to vaginal bleeding, contractions, leaking of fluid and fetal movement were reviewed in detail with the patient. Please refer to After Visit Summary for other counseling recommendations.   Return in about 2 weeks (around 12/02/2022).  Future Appointments  Date Time Provider Department Center  11/23/2022  9:30 AM [facility] NURSE [facility]  11/23/2022  9:45 AM [facility] NST [facility]  11/30/2022  3:30 PM [facility] NURSE [facility]  11/30/2022  3:45 PM [facility] US5 [facility]  12/02/2022 11:15 AM Jerene Bears, MD [facility]  12/07/2022  3:30 PM [facility] NURSE [facility]  12/07/2022  3:45 PM [facility] US5 [facility]  12/14/2022  3:30 PM [facility] NURSE [facility]  12/14/2022  3:45 PM [facility] US4 [facility]  12/16/2022 10:15 AM Jerene Bears, MD [facility]  12/17/2022  8:40 AM Thomasene Ripple, DO [facility] None  12/21/2022  3:30 PM [facility] NURSE [facility]  12/21/2022  3:45 PM [facility] US4 [facility]  12/23/2022 10:35 AM Jerene Bears, MD [facility]  12/30/2022 11:15 AM Jerene Bears, MD [facility]  01/06/2023 10:35 AM Jerene Bears, MD [facility]  01/13/2023 10:15 AM Jerene Bears, MD [facility]  06/23/2023  3:00 PM Ivonne Andrew, NP [facility] None  Megan Salon, MD

## 2022-11-21 NOTE — Progress Notes (Signed)
msm

## 2022-11-22 NOTE — Addendum Note (Signed)
Addended by: Harrie Jeans on: 11/22/2022 12:10 PM   Modules accepted: Orders

## 2022-11-23 ENCOUNTER — Ambulatory Visit: Payer: No Typology Code available for payment source

## 2022-11-23 ENCOUNTER — Ambulatory Visit: Payer: No Typology Code available for payment source | Attending: Obstetrics and Gynecology | Admitting: *Deleted

## 2022-11-23 ENCOUNTER — Other Ambulatory Visit: Payer: No Typology Code available for payment source

## 2022-11-23 ENCOUNTER — Ambulatory Visit (HOSPITAL_BASED_OUTPATIENT_CLINIC_OR_DEPARTMENT_OTHER): Payer: No Typology Code available for payment source | Admitting: *Deleted

## 2022-11-23 VITALS — BP 128/73 | HR 84

## 2022-11-23 DIAGNOSIS — Z3A33 33 weeks gestation of pregnancy: Secondary | ICD-10-CM | POA: Insufficient documentation

## 2022-11-23 DIAGNOSIS — O10919 Unspecified pre-existing hypertension complicating pregnancy, unspecified trimester: Secondary | ICD-10-CM

## 2022-11-23 DIAGNOSIS — O26893 Other specified pregnancy related conditions, third trimester: Secondary | ICD-10-CM | POA: Insufficient documentation

## 2022-11-23 DIAGNOSIS — O163 Unspecified maternal hypertension, third trimester: Secondary | ICD-10-CM | POA: Insufficient documentation

## 2022-11-23 DIAGNOSIS — O10013 Pre-existing essential hypertension complicating pregnancy, third trimester: Secondary | ICD-10-CM | POA: Diagnosis not present

## 2022-11-23 DIAGNOSIS — O0990 Supervision of high risk pregnancy, unspecified, unspecified trimester: Secondary | ICD-10-CM

## 2022-11-23 NOTE — Procedures (Signed)
Diana Robinson Mar 01, 2001 [redacted]w[redacted]d  Fetus A Non-Stress Test Interpretation for 11/23/22  Indication: Chronic Hypertenstion  Fetal Heart Rate A Mode: External Baseline Rate (A): 140 bpm Variability: Moderate Accelerations: 15 x 15 Decelerations: None Multiple birth?: No  Uterine Activity Mode: Palpation, Toco Contraction Frequency (min): UI Contraction Quality: Mild Resting Tone Palpated: Relaxed Resting Time: Adequate  Interpretation (Fetal Testing) Nonstress Test Interpretation: Reactive Comments: Dr. Grace Bushy reviewed tracing.

## 2022-11-30 ENCOUNTER — Ambulatory Visit: Payer: No Typology Code available for payment source | Attending: Maternal & Fetal Medicine

## 2022-11-30 ENCOUNTER — Ambulatory Visit: Payer: No Typology Code available for payment source

## 2022-11-30 VITALS — BP 135/69 | HR 93

## 2022-11-30 DIAGNOSIS — O0990 Supervision of high risk pregnancy, unspecified, unspecified trimester: Secondary | ICD-10-CM

## 2022-11-30 DIAGNOSIS — O10919 Unspecified pre-existing hypertension complicating pregnancy, unspecified trimester: Secondary | ICD-10-CM | POA: Diagnosis present

## 2022-11-30 DIAGNOSIS — O285 Abnormal chromosomal and genetic finding on antenatal screening of mother: Secondary | ICD-10-CM | POA: Diagnosis not present

## 2022-11-30 DIAGNOSIS — D563 Thalassemia minor: Secondary | ICD-10-CM | POA: Diagnosis not present

## 2022-11-30 DIAGNOSIS — O3413 Maternal care for benign tumor of corpus uteri, third trimester: Secondary | ICD-10-CM | POA: Insufficient documentation

## 2022-11-30 DIAGNOSIS — O10013 Pre-existing essential hypertension complicating pregnancy, third trimester: Secondary | ICD-10-CM

## 2022-11-30 DIAGNOSIS — D259 Leiomyoma of uterus, unspecified: Secondary | ICD-10-CM | POA: Diagnosis present

## 2022-11-30 DIAGNOSIS — Z3A34 34 weeks gestation of pregnancy: Secondary | ICD-10-CM

## 2022-12-02 ENCOUNTER — Ambulatory Visit (INDEPENDENT_AMBULATORY_CARE_PROVIDER_SITE_OTHER): Payer: Medicaid Other | Admitting: Obstetrics & Gynecology

## 2022-12-02 VITALS — BP 135/88 | HR 91 | Wt 188.0 lb

## 2022-12-02 DIAGNOSIS — R87612 Low grade squamous intraepithelial lesion on cytologic smear of cervix (LGSIL): Secondary | ICD-10-CM

## 2022-12-02 DIAGNOSIS — Z3A34 34 weeks gestation of pregnancy: Secondary | ICD-10-CM

## 2022-12-02 DIAGNOSIS — O10919 Unspecified pre-existing hypertension complicating pregnancy, unspecified trimester: Secondary | ICD-10-CM

## 2022-12-02 DIAGNOSIS — O99013 Anemia complicating pregnancy, third trimester: Secondary | ICD-10-CM

## 2022-12-02 DIAGNOSIS — R011 Cardiac murmur, unspecified: Secondary | ICD-10-CM

## 2022-12-02 DIAGNOSIS — O0990 Supervision of high risk pregnancy, unspecified, unspecified trimester: Secondary | ICD-10-CM

## 2022-12-02 DIAGNOSIS — D563 Thalassemia minor: Secondary | ICD-10-CM

## 2022-12-02 NOTE — Progress Notes (Signed)
   PRENATAL VISIT NOTE  Subjective:  Diana Robinson is a 22 y.o. G1P0000 at [redacted]w[redacted]d being seen today for ongoing prenatal care.  She is currently monitored for the following issues for this high-risk pregnancy and has Frequent headaches; Supervision of high risk pregnancy, antepartum; Murmur; Chronic hypertension in pregnancy; LGSIL on Pap smear of cervix; Alpha thalassemia silent carrier; Neck pain, acute; and Anemia in pregnancy, third trimester on their problem list.  Patient reports  mid back pain.  OTC treatments/heat/stretching discussed .  Contractions: Not present. Vag. Bleeding: None.  Movement: Present. Denies leaking of fluid.   She does The following portions of the patient's history were reviewed and updated as appropriate: allergies, current medications, past family history, past medical history, past social history, past surgical history and problem list.   Objective:   Vitals:   12/02/22 1145  BP: 135/88  Pulse: 91  Weight: 188 lb (85.3 kg)    Fetal Status: Fetal Heart Rate (bpm): 161   Movement: Present     General:  Alert, oriented and cooperative. Patient is in no acute distress.  Skin: Skin is warm and dry. No rash noted.   Cardiovascular: Normal heart rate noted  Respiratory: Normal respiratory effort, no problems with respiration noted  Abdomen: Soft, gravid, appropriate for gestational age.  Pain/Pressure: Absent     Pelvic: Cervical exam deferred        Extremities: Normal range of motion.  Edema: None  Mental Status: Normal mood and affect. Normal behavior. Normal judgment and thought content.   Assessment and Plan:  Pregnancy: G1P0000 at [redacted]w[redacted]d 1. Supervision of high risk pregnancy, antepartum - on PNV and baby ASA - recheck 2 weeks.  Planning cultures at that appt - desires doula.  Have placed referral and message sent to inform about this for pt as well.  2. [redacted] weeks gestation of pregnancy  3. Alpha thalassemia silent carrier - FOB provided kit but not  submitted  4. Anemia in pregnancy, third trimester - taking daily iron  5. Chronic hypertension in pregnancy - on Procardia XL  daily - having weekly BPPs and q 4 month growth scans - discussed delivery timing.  Pt desires delivery closer to 39 weeks.  6. LGSIL on Pap smear of cervix - plan pp pap follow up  7. Murmur - saw Dr. Servando Salina.  Echo was normal.  Preterm labor symptoms and general obstetric precautions including but not limited to vaginal bleeding, contractions, leaking of fluid and fetal movement were reviewed in detail with the patient. Please refer to After Visit Summary for other counseling recommendations.   Return in about 2 weeks (around 12/16/2022).  Future Appointments  Date Time Provider Department Center  12/07/2022  3:30 PM [facility] NURSE [facility]  12/07/2022  3:45 PM [facility] US5 [facility]  12/14/2022  3:30 PM [facility] NURSE [facility]  12/14/2022  3:45 PM [facility] US4 [facility]  12/16/2022 10:15 AM Jerene Bears, MD [facility]  12/17/2022  8:40 AM Thomasene Ripple, DO [facility] None  12/21/2022  3:30 PM [facility] NURSE [facility]  12/21/2022  3:45 PM [facility] US4 [facility]  12/23/2022 10:35 AM Jerene Bears, MD [facility]  12/30/2022 11:15 AM Jerene Bears, MD [facility]  01/06/2023 10:35 AM Jerene Bears, MD [facility]  01/13/2023 10:15 AM Jerene Bears, MD [facility]  06/23/2023  3:00 PM Ivonne Andrew, NP [facility] None    Jerene Bears, MD

## 2022-12-06 ENCOUNTER — Encounter (HOSPITAL_BASED_OUTPATIENT_CLINIC_OR_DEPARTMENT_OTHER): Payer: Self-pay | Admitting: Obstetrics & Gynecology

## 2022-12-07 ENCOUNTER — Ambulatory Visit: Payer: No Typology Code available for payment source | Admitting: *Deleted

## 2022-12-07 ENCOUNTER — Encounter: Payer: Self-pay | Admitting: *Deleted

## 2022-12-07 ENCOUNTER — Ambulatory Visit: Payer: No Typology Code available for payment source | Attending: Maternal & Fetal Medicine

## 2022-12-07 VITALS — BP 132/74 | HR 88

## 2022-12-07 DIAGNOSIS — O285 Abnormal chromosomal and genetic finding on antenatal screening of mother: Secondary | ICD-10-CM | POA: Diagnosis not present

## 2022-12-07 DIAGNOSIS — O10013 Pre-existing essential hypertension complicating pregnancy, third trimester: Secondary | ICD-10-CM

## 2022-12-07 DIAGNOSIS — O3413 Maternal care for benign tumor of corpus uteri, third trimester: Secondary | ICD-10-CM | POA: Insufficient documentation

## 2022-12-07 DIAGNOSIS — D259 Leiomyoma of uterus, unspecified: Secondary | ICD-10-CM

## 2022-12-07 DIAGNOSIS — O0990 Supervision of high risk pregnancy, unspecified, unspecified trimester: Secondary | ICD-10-CM

## 2022-12-07 DIAGNOSIS — Z3A35 35 weeks gestation of pregnancy: Secondary | ICD-10-CM

## 2022-12-07 DIAGNOSIS — O10919 Unspecified pre-existing hypertension complicating pregnancy, unspecified trimester: Secondary | ICD-10-CM | POA: Diagnosis present

## 2022-12-07 DIAGNOSIS — D563 Thalassemia minor: Secondary | ICD-10-CM | POA: Diagnosis not present

## 2022-12-14 ENCOUNTER — Ambulatory Visit: Payer: Medicaid Other | Admitting: *Deleted

## 2022-12-14 ENCOUNTER — Ambulatory Visit: Payer: Medicaid Other | Attending: Maternal & Fetal Medicine

## 2022-12-14 VITALS — BP 133/75 | HR 87

## 2022-12-14 DIAGNOSIS — D563 Thalassemia minor: Secondary | ICD-10-CM | POA: Diagnosis not present

## 2022-12-14 DIAGNOSIS — O285 Abnormal chromosomal and genetic finding on antenatal screening of mother: Secondary | ICD-10-CM | POA: Diagnosis not present

## 2022-12-14 DIAGNOSIS — Z3A36 36 weeks gestation of pregnancy: Secondary | ICD-10-CM

## 2022-12-14 DIAGNOSIS — O10013 Pre-existing essential hypertension complicating pregnancy, third trimester: Secondary | ICD-10-CM

## 2022-12-14 DIAGNOSIS — O10913 Unspecified pre-existing hypertension complicating pregnancy, third trimester: Secondary | ICD-10-CM | POA: Diagnosis present

## 2022-12-14 DIAGNOSIS — O0990 Supervision of high risk pregnancy, unspecified, unspecified trimester: Secondary | ICD-10-CM | POA: Insufficient documentation

## 2022-12-16 ENCOUNTER — Other Ambulatory Visit (HOSPITAL_COMMUNITY)
Admission: RE | Admit: 2022-12-16 | Discharge: 2022-12-16 | Disposition: A | Discharge status: Home / Self Care | Payer: Medicaid Other | Source: Ambulatory Visit | Attending: Obstetrics & Gynecology | Admitting: Obstetrics & Gynecology

## 2022-12-16 ENCOUNTER — Ambulatory Visit (INDEPENDENT_AMBULATORY_CARE_PROVIDER_SITE_OTHER): Payer: Medicaid Other | Admitting: Obstetrics & Gynecology

## 2022-12-16 VITALS — BP 125/87 | HR 88 | Wt 190.6 lb

## 2022-12-16 DIAGNOSIS — O0993 Supervision of high risk pregnancy, unspecified, third trimester: Secondary | ICD-10-CM | POA: Insufficient documentation

## 2022-12-16 DIAGNOSIS — O10919 Unspecified pre-existing hypertension complicating pregnancy, unspecified trimester: Secondary | ICD-10-CM

## 2022-12-16 DIAGNOSIS — Z362 Encounter for other antenatal screening follow-up: Secondary | ICD-10-CM

## 2022-12-16 DIAGNOSIS — O99013 Anemia complicating pregnancy, third trimester: Secondary | ICD-10-CM

## 2022-12-16 DIAGNOSIS — D563 Thalassemia minor: Secondary | ICD-10-CM

## 2022-12-16 DIAGNOSIS — Z3A36 36 weeks gestation of pregnancy: Secondary | ICD-10-CM | POA: Insufficient documentation

## 2022-12-16 DIAGNOSIS — O0990 Supervision of high risk pregnancy, unspecified, unspecified trimester: Secondary | ICD-10-CM | POA: Diagnosis not present

## 2022-12-16 DIAGNOSIS — R87612 Low grade squamous intraepithelial lesion on cytologic smear of cervix (LGSIL): Secondary | ICD-10-CM

## 2022-12-16 NOTE — Progress Notes (Signed)
   PRENATAL VISIT NOTE  Subjective:  Diana Robinson is a 22 y.o. G1P0000 at [redacted]w[redacted]d being seen today for ongoing prenatal care.  She is currently monitored for the following issues for this high-risk pregnancy and has Frequent headaches; Supervision of high risk pregnancy, antepartum; Murmur; Chronic hypertension in pregnancy; LGSIL on Pap smear of cervix; Alpha thalassemia silent carrier; and Anemia in pregnancy, third trimester on their problem list.  Patient reports no complaints.  Contractions: Not present. Vag. Bleeding: None.  Movement: Present. Denies leaking of fluid.   The following portions of the patient's history were reviewed and updated as appropriate: allergies, current medications, past family history, past medical history, past social history, past surgical history and problem list.   Objective:   Vitals:   12/16/22 1044  BP: 125/87  Pulse: 88  Weight: 190 lb 9.6 oz (86.5 kg)    Fetal Status: Fetal Heart Rate (bpm): 143 Fundal Height: 37 cm Movement: Present     General:  Alert, oriented and cooperative. Patient is in no acute distress.  Skin: Skin is warm and dry. No rash noted.   Cardiovascular: Normal heart rate noted  Respiratory: Normal respiratory effort, no problems with respiration noted  Abdomen: Soft, gravid, appropriate for gestational age.  Pain/Pressure: Present     Pelvic: Cervical exam performed in the presence of a chaperone        Extremities: Normal range of motion.  Edema: None  Mental Status: Normal mood and affect. Normal behavior. Normal judgment and thought content.   Assessment and Plan:  Pregnancy: G1P0000 at [redacted]w[redacted]d 1. Supervision of high risk pregnancy, antepartum - on PNV - Culture, beta strep (group b only) - Cervicovaginal ancillary only( [place]) - recheck 1 week  2. [redacted] weeks gestation of pregnancy  3. Alpha thalassemia silent carrier - FOB not tested  4. Anemia in pregnancy, third trimester - on oral iron  5. Chronic  hypertension in pregnancy - on procardia XL 60mg  daily.  Blood pressures good. - having weekliy BPP - last growth san 5/8 at 3137gm 82%tile - pt aware delivery recommendations are bewteen 37 - 39 weeks.  She really doesn't want induction but willing to have at 39 weeks.   - will proceed with scheduling  6. LGSIL on Pap smear of cervix - plan to repeat PP  Preterm labor symptoms and general obstetric precautions including but not limited to vaginal bleeding, contractions, leaking of fluid and fetal movement were reviewed in detail with the patient. Please refer to After Visit Summary for other counseling recommendations.   Return in about 1 week (around 12/23/2022).  Future Appointments  Date Time Provider Department Center  12/21/2022  3:15 PM [facility] NST [facility]  12/21/2022  3:30 PM [facility] NURSE [facility]  12/23/2022 10:35 AM Jerene Bears, MD [facility]  12/30/2022 11:15 AM Jerene Bears, MD [facility]  01/06/2023 10:35 AM Jerene Bears, MD [facility]  01/13/2023 10:15 AM Jerene Bears, MD [facility]  02/24/2023  9:20 AM Thomasene Ripple, DO [facility] None  06/23/2023  3:00 PM Ivonne Andrew, NP [facility] None    Jerene Bears, MD

## 2022-12-17 ENCOUNTER — Ambulatory Visit: Payer: No Typology Code available for payment source | Attending: Cardiology | Admitting: Cardiology

## 2022-12-17 ENCOUNTER — Encounter: Payer: Self-pay | Admitting: Cardiology

## 2022-12-17 VITALS — BP 130/82 | HR 83 | Ht 66.0 in | Wt 191.2 lb

## 2022-12-17 DIAGNOSIS — O10919 Unspecified pre-existing hypertension complicating pregnancy, unspecified trimester: Secondary | ICD-10-CM | POA: Diagnosis not present

## 2022-12-17 DIAGNOSIS — Z3A36 36 weeks gestation of pregnancy: Secondary | ICD-10-CM | POA: Diagnosis not present

## 2022-12-17 LAB — CERVICOVAGINAL ANCILLARY ONLY
Chlamydia: NEGATIVE
Comment: NEGATIVE
Comment: NORMAL
Neisseria Gonorrhea: NEGATIVE

## 2022-12-17 NOTE — Patient Instructions (Signed)
Medication Instructions:  Your physician recommends that you continue on your current medications as directed. Please refer to the Current Medication list given to you today.  *If you need a refill on your cardiac medications before your next appointment, please call your pharmacy*   Lab Work: None   Testing/Procedures: None   Follow-Up: At [facility], you and your health needs are our priority.  As part of our continuing mission to provide you with exceptional heart care, we have created designated Provider Care Teams.  These Care Teams include your primary Cardiologist (physician) and Advanced Practice Providers (APPs -  Physician Assistants and Nurse Practitioners) who all work together to provide you with the care you need, when you need it.   Your next appointment:   10 week(s) via MyChart  Provider:   Thomasene Ripple, DO

## 2022-12-17 NOTE — Progress Notes (Signed)
Cardio-Obstetrics Clinic  New Evaluation  Date:  12/17/2022   ID:  Diana Robinson, DOB Jan 25, 2001, MRN [id number]  PCP:  Ivonne Andrew, NP   [facility] Providers Cardiologist:  Thomasene Ripple, DO  Electrophysiologist:  None       Referring MD: Ivonne Andrew, NP   Chief Complaint:   History of Present Illness:    Diana Robinson is a 22 y.o. female [G1P0000] who is being seen today for the evaluation of murmur at the request of Ivonne Andrew, NP.   Medical history includes hypertension which was diagnosed 3 years ago she is currently on nifedipine, and has been started on aspirin 81 mg for preeclampsia prophylaxis appropriately.    At her last visit, I ordered an echo to assess for any valvular abnormalities.   I saw the patient on 10/01/2022 at that time no medi changes were made. She is here today for a follow up visit.  She is [redacted] weeks pregnant with no other complaints at this time.   Prior CV Studies Reviewed: The following studies were reviewed today:  TTE 07/28/2022 IMPRESSIONS     1. Left ventricular ejection fraction, by estimation, is 60 to 65%. Left  ventricular ejection fraction by 3D volume is 67 %. The left ventricle has  normal function. The left ventricle has no regional wall motion  abnormalities. Left ventricular diastolic   parameters were normal. The average left ventricular global longitudinal  strain is -26.1 %. The global longitudinal strain is normal.   2. Right ventricular systolic function is normal. The right ventricular  size is normal. There is normal pulmonary artery systolic pressure.   3. The mitral valve is normal in structure. No evidence of mitral valve  regurgitation. No evidence of mitral stenosis.   4. The aortic valve is tricuspid. Aortic valve regurgitation is not  visualized. No aortic stenosis is present.   5. The inferior vena cava is normal in size with greater than 50%  respiratory variability, suggesting right  atrial pressure of 3 mmHg.   Conclusion(s)/Recommendation(s): Normal biventricular function without  evidence of hemodynamically significant valvular heart disease.   FINDINGS   Left Ventricle: Left ventricular ejection fraction, by estimation, is 60  to 65%. Left ventricular ejection fraction by 3D volume is 67 %. The left  ventricle has normal function. The left ventricle has no regional wall  motion abnormalities. The average  left ventricular global longitudinal strain is -26.1 %. The global  longitudinal strain is normal. The left ventricular internal cavity size  was normal in size. There is no left ventricular hypertrophy. Left  ventricular diastolic parameters were normal.   Right Ventricle: The right ventricular size is normal. No increase in  right ventricular wall thickness. Right ventricular systolic function is  normal. There is normal pulmonary artery systolic pressure. The tricuspid  regurgitant velocity is 2.10 m/s, and   with an assumed right atrial pressure of 3 mmHg, the estimated right  ventricular systolic pressure is 20.6 mmHg.   Left Atrium: Left atrial size was normal in size.   Right Atrium: Right atrial size was normal in size.   Pericardium: There is no evidence of pericardial effusion.   Mitral Valve: The mitral valve is normal in structure. No evidence of  mitral valve regurgitation. No evidence of mitral valve stenosis.   Tricuspid Valve: The tricuspid valve is normal in structure. Tricuspid  valve regurgitation is trivial. No evidence of tricuspid stenosis.   Aortic Valve: The aortic valve  is tricuspid. Aortic valve regurgitation is  not visualized. No aortic stenosis is present.   Pulmonic Valve: The pulmonic valve was normal in structure. Pulmonic valve  regurgitation is trivial. No evidence of pulmonic stenosis.   Aorta: The aortic root is normal in size and structure.   Venous: The inferior vena cava is normal in size with greater than 50%   respiratory variability, suggesting right atrial pressure of 3 mmHg.   IAS/Shunts: No atrial level shunt detected by color flow Doppler.       Past Medical History:  Diagnosis Date   Heart murmur    Hypertension    Migraine 05/2019   Vitamin D deficiency 10/2020    Past Surgical History:  Procedure Laterality Date   WISDOM TOOTH EXTRACTION        OB History     Gravida  1   Para  0   Term  0   Preterm  0   AB  0   Living  0      SAB  0   IAB  0   Ectopic  0   Multiple  0   Live Births  0               Current Medications: Current Meds  Medication Sig   aspirin 81 MG chewable tablet Chew 1 tablet (81 mg total) by mouth daily.   ferrous sulfate 325 (65 FE) MG tablet Take 1 tablet (325 mg total) by mouth daily with breakfast.   NIFEdipine (PROCARDIA XL) 60 MG 24 hr tablet Take 1 tablet (60 mg total) by mouth daily.   omeprazole (PRILOSEC) 40 MG capsule Take 1 capsule (40 mg total) by mouth daily.   Prenatal Vit-Fe Fumarate-FA (PRENATAL VITAMIN) 27-0.8 MG TABS Take 1 tablet by mouth daily.     Allergies:   Patient has no known allergies.   Social History   Socioeconomic History   Marital status: Single    Spouse name: Not on file   Number of children: Not on file   Years of education: Not on file   Highest education level: Not on file  Occupational History   Not on file  Tobacco Use   Smoking status: Never   Smokeless tobacco: Never  Vaping Use   Vaping Use: Never used  Substance and Sexual Activity   Alcohol use: Never   Drug use: Never   Sexual activity: Yes    Birth control/protection: None  Other Topics Concern   Not on file  Social History Narrative   Not on file   Social Determinants of Health   Financial Resource Strain: Low Risk  (05/31/2022)   Overall Financial Resource Strain (CARDIA)    Difficulty of Paying Living Expenses: Not hard at all  Food Insecurity: No Food Insecurity (05/31/2022)   Hunger Vital Sign     Worried About Running Out of Food in the Last Year: Never true    Ran Out of Food in the Last Year: Never true  Transportation Needs: No Transportation Needs (05/31/2022)   PRAPARE - Administrator, Civil Service (Medical): No    Lack of Transportation (Non-Medical): No  Physical Activity: Inactive (05/31/2022)   Exercise Vital Sign    Days of Exercise per Week: 0 days    Minutes of Exercise per Session: 0 min  Stress: No Stress Concern Present (05/31/2022)   [employer] of Occupational Health - Occupational Stress Questionnaire    Feeling of Stress : Not  at all  Social Connections: Moderately Integrated (05/31/2022)   Social Connection and Isolation Panel [NHANES]    Frequency of Communication with Friends and Family: Twice a week    Frequency of Social Gatherings with Friends and Family: Once a week    Attends Religious Services: More than 4 times per year    Active Member of [employer] or Organizations: No    Attends Engineer, structural: Never    Marital Status: Living with partner      Family History  Problem Relation Age of Onset   Hypertension Mother    Healthy Mother    Healthy Father    Asthma Neg Hx    Cancer Neg Hx    Diabetes Neg Hx    Heart disease Neg Hx       ROS:   Please see the history of present illness.     All other systems reviewed and are negative.   Labs/EKG Reviewed:    EKG:   None today  Recent Labs: 06/15/2022: ALT 9; BUN 13; Creatinine, Ser 0.79; Potassium 3.8; Sodium 137 10/11/2022: Hemoglobin 10.0; Platelets 199   Recent Lipid Panel Lab Results  Component Value Date/Time   CHOL 158 11/04/2021 11:23 AM   TRIG 39 11/04/2021 11:23 AM   HDL 73 11/04/2021 11:23 AM   CHOLHDL 2.2 11/04/2021 11:23 AM   LDLCALC 76 11/04/2021 11:23 AM    Physical Exam:    VS:  BP 130/82   Pulse 83   Ht 5\' 6"  (1.676 m)   Wt 191 lb 3.2 oz (86.7 kg)   LMP 04/06/2022 (Approximate)   SpO2 100%   BMI 30.86 kg/m     Wt Readings from  Last 3 Encounters:  12/17/22 191 lb 3.2 oz (86.7 kg)  12/16/22 190 lb 9.6 oz (86.5 kg)  12/02/22 188 lb (85.3 kg)     GEN: Well nourished, well developed in no acute distress HEENT: Normal NECK: No JVD; No carotid bruits LYMPHATICS: No lymphadenopathy CARDIAC: RRR, 3 out of 6 holosystolic mid ejection murmurs, rubs, gallops RESPIRATORY:  Clear to auscultation without rales, wheezing or rhonchi  ABDOMEN: Soft, non-tender, non-distended MUSCULOSKELETAL:  No edema; No deformity  SKIN: Warm and dry NEUROLOGIC:  Alert and oriented x 3 PSYCHIATRIC:  Normal affect    Risk Assessment/Risk Calculators:                  ASSESSMENT & PLAN:    Chronic hypertension pregnancy  Blood pressure is acceptable, continue with current antihypertensive regimen. Continue current dose of Nifedipine  The patient is in agreement with the above plan. The patient left the office in stable condition.  The patient will follow up in 10 week virtually or sooner if needed.  Patient Instructions  Medication Instructions:  Your physician recommends that you continue on your current medications as directed. Please refer to the Current Medication list given to you today.  *If you need a refill on your cardiac medications before your next appointment, please call your pharmacy*   Lab Work: None   Testing/Procedures: None   Follow-Up: At [facility], you and your health needs are our priority.  As part of our continuing mission to provide you with exceptional heart care, we have created designated Provider Care Teams.  These Care Teams include your primary Cardiologist (physician) and Advanced Practice Providers (APPs -  Physician Assistants and Nurse Practitioners) who all work together to provide you with the care you need, when you need it.  Your next appointment:   10 week(s) via MyChart  Provider:   Thomasene Ripple, DO        Dispo:  No follow-ups on file.   Medication  Adjustments/Labs and Tests Ordered: Current medicines are reviewed at length with the patient today.  Concerns regarding medicines are outlined above.  Tests Ordered: No orders of the defined types were placed in this encounter.  Medication Changes: No orders of the defined types were placed in this encounter.

## 2022-12-20 LAB — CULTURE, BETA STREP (GROUP B ONLY): Strep Gp B Culture: NEGATIVE

## 2022-12-21 ENCOUNTER — Ambulatory Visit: Payer: Medicaid Other | Attending: Maternal & Fetal Medicine | Admitting: *Deleted

## 2022-12-21 ENCOUNTER — Ambulatory Visit (HOSPITAL_BASED_OUTPATIENT_CLINIC_OR_DEPARTMENT_OTHER): Payer: Medicaid Other | Admitting: *Deleted

## 2022-12-21 ENCOUNTER — Other Ambulatory Visit: Payer: No Typology Code available for payment source

## 2022-12-21 VITALS — BP 112/73 | HR 90

## 2022-12-21 DIAGNOSIS — O10013 Pre-existing essential hypertension complicating pregnancy, third trimester: Secondary | ICD-10-CM | POA: Diagnosis not present

## 2022-12-21 DIAGNOSIS — O10913 Unspecified pre-existing hypertension complicating pregnancy, third trimester: Secondary | ICD-10-CM

## 2022-12-21 DIAGNOSIS — Z3A37 37 weeks gestation of pregnancy: Secondary | ICD-10-CM

## 2022-12-21 DIAGNOSIS — O0990 Supervision of high risk pregnancy, unspecified, unspecified trimester: Secondary | ICD-10-CM

## 2022-12-21 NOTE — Procedures (Signed)
Diana Robinson [birth year] [redacted]w[redacted]d  Fetus A Non-Stress Test Interpretation for 12/21/22  Indication: Chronic Hypertenstion  Fetal Heart Rate A Mode: External Baseline Rate (A): 130 bpm Variability: Moderate Accelerations: 15 x 15 Decelerations: None Multiple birth?: No  Uterine Activity Mode: Palpation, Toco Contraction Frequency (min): UI Contraction Quality: Mild Resting Tone Palpated: Relaxed Resting Time: Adequate  Interpretation (Fetal Testing) Nonstress Test Interpretation: Reactive Comments: Dr., Judeth Cornfield reviewed tracing.

## 2022-12-23 ENCOUNTER — Ambulatory Visit (INDEPENDENT_AMBULATORY_CARE_PROVIDER_SITE_OTHER): Payer: Medicaid Other | Admitting: Obstetrics & Gynecology

## 2022-12-23 VITALS — BP 130/78 | HR 95 | Wt 191.6 lb

## 2022-12-23 DIAGNOSIS — R011 Cardiac murmur, unspecified: Secondary | ICD-10-CM

## 2022-12-23 DIAGNOSIS — D563 Thalassemia minor: Secondary | ICD-10-CM

## 2022-12-23 DIAGNOSIS — O10919 Unspecified pre-existing hypertension complicating pregnancy, unspecified trimester: Secondary | ICD-10-CM

## 2022-12-23 DIAGNOSIS — O99013 Anemia complicating pregnancy, third trimester: Secondary | ICD-10-CM

## 2022-12-23 DIAGNOSIS — R87612 Low grade squamous intraepithelial lesion on cytologic smear of cervix (LGSIL): Secondary | ICD-10-CM

## 2022-12-23 DIAGNOSIS — O0990 Supervision of high risk pregnancy, unspecified, unspecified trimester: Secondary | ICD-10-CM

## 2022-12-23 DIAGNOSIS — Z3A37 37 weeks gestation of pregnancy: Secondary | ICD-10-CM

## 2022-12-23 LAB — CBC
Hematocrit: 32.5 % — ABNORMAL LOW (ref 34.0–46.6)
Hemoglobin: 10.7 g/dL — ABNORMAL LOW (ref 11.1–15.9)
MCH: 26.7 pg (ref 26.6–33.0)
MCHC: 32.9 g/dL (ref 31.5–35.7)
MCV: 81 fL (ref 79–97)
Platelets: 216 10*3/uL (ref 150–450)
RBC: 4.01 x10E6/uL (ref 3.77–5.28)
RDW: 13.6 % (ref 11.7–15.4)
WBC: 5.9 10*3/uL (ref 3.4–10.8)

## 2022-12-23 NOTE — Progress Notes (Signed)
   PRENATAL VISIT NOTE  Subjective:  Diana Robinson is a 22 y.o. G1P0000 at [redacted]w[redacted]d being seen today for ongoing prenatal care.  She is currently monitored for the following issues for this high-risk pregnancy and has Frequent headaches; Supervision of high risk pregnancy, antepartum; Murmur; Chronic hypertension in pregnancy; LGSIL on Pap smear of cervix; Alpha thalassemia silent carrier; and Anemia in pregnancy, third trimester on their problem list.  Patient reports  some small cramping .  Contractions: Irregular. Vag. Bleeding: None.  Movement: Present. Denies leaking of fluid.   The following portions of the patient's history were reviewed and updated as appropriate: allergies, current medications, past family history, past medical history, past social history, past surgical history and problem list.   Objective:   Vitals:   12/23/22 1101  BP: 130/78  Pulse: 95  Weight: 191 lb 9.6 oz (86.9 kg)    Fetal Status: Fetal Heart Rate (bpm): 175   Movement: Present     General:  Alert, oriented and cooperative. Patient is in no acute distress.  Skin: Skin is warm and dry. No rash noted.   Cardiovascular: Normal heart rate noted  Respiratory: Normal respiratory effort, no problems with respiration noted  Abdomen: Soft, gravid, appropriate for gestational age.  Pain/Pressure: Present     Pelvic: Cervical exam deferred        Extremities: Normal range of motion.  Edema: None  Mental Status: Normal mood and affect. Normal behavior. Normal judgment and thought content.   Assessment and Plan:  Pregnancy: G1P0000 at [redacted]w[redacted]d 1. Supervision of high risk pregnancy, antepartum - on PNV and baby ASA  2. [redacted] weeks gestation of pregnancy - recheck 1 week - will return for NST tomorrow - has BPP scheduled for next week - induction is going to be scheduled aroumd 39 weeks  3. Chronic hypertension in pregnancy - on procardia XL 60mg  daily  4. Alpha thalassemia silent carrier - FOB declined  testing  5. Anemia in pregnancy, third trimester - CBC  6. LGSIL on Pap smear of cervix - repeat PP  7. Murmur - did see Dr. Servando Salina and had echo 07/2022 and has follow up this summer scheduled  Term labor symptoms and general obstetric precautions including but not limited to vaginal bleeding, contractions, leaking of fluid and fetal movement were reviewed in detail with the patient. Please refer to After Visit Summary for other counseling recommendations.   Return in about 1 week (around 12/30/2022).  Future Appointments  Date Time Provider Department Center  12/24/2022  9:00 AM [facility] NURSE [facility]  12/30/2022 11:15 AM Jerene Bears, MD [facility]  12/30/2022  1:15 PM [facility] NST [facility]  01/06/2023 10:35 AM Jerene Bears, MD [facility]  01/13/2023 10:15 AM Jerene Bears, MD [facility]  02/24/2023  9:20 AM Thomasene Ripple, DO [facility] None  06/23/2023  3:00 PM Ivonne Andrew, NP [facility] None    Jerene Bears, MD

## 2022-12-24 ENCOUNTER — Ambulatory Visit (INDEPENDENT_AMBULATORY_CARE_PROVIDER_SITE_OTHER): Payer: Medicaid Other | Admitting: *Deleted

## 2022-12-24 VITALS — BP 128/85 | HR 86 | Wt 192.4 lb

## 2022-12-24 DIAGNOSIS — Z3A37 37 weeks gestation of pregnancy: Secondary | ICD-10-CM

## 2022-12-24 DIAGNOSIS — O0990 Supervision of high risk pregnancy, unspecified, unspecified trimester: Secondary | ICD-10-CM | POA: Diagnosis not present

## 2022-12-24 NOTE — Progress Notes (Signed)
Pt here for NST. Tracing reactive, reassuring for gestational age. Pt with no complaints.

## 2022-12-29 ENCOUNTER — Other Ambulatory Visit: Payer: Self-pay | Admitting: Advanced Practice Midwife

## 2022-12-30 ENCOUNTER — Other Ambulatory Visit (INDEPENDENT_AMBULATORY_CARE_PROVIDER_SITE_OTHER): Payer: Medicaid Other

## 2022-12-30 ENCOUNTER — Ambulatory Visit (INDEPENDENT_AMBULATORY_CARE_PROVIDER_SITE_OTHER): Payer: Medicaid Other | Admitting: Obstetrics & Gynecology

## 2022-12-30 ENCOUNTER — Ambulatory Visit: Payer: Medicaid Other | Admitting: *Deleted

## 2022-12-30 ENCOUNTER — Encounter (HOSPITAL_COMMUNITY): Payer: Self-pay | Admitting: *Deleted

## 2022-12-30 ENCOUNTER — Telehealth (HOSPITAL_COMMUNITY): Payer: Self-pay | Admitting: *Deleted

## 2022-12-30 ENCOUNTER — Other Ambulatory Visit: Payer: Self-pay

## 2022-12-30 VITALS — BP 134/82 | HR 101 | Wt 200.2 lb

## 2022-12-30 DIAGNOSIS — R87612 Low grade squamous intraepithelial lesion on cytologic smear of cervix (LGSIL): Secondary | ICD-10-CM

## 2022-12-30 DIAGNOSIS — O10919 Unspecified pre-existing hypertension complicating pregnancy, unspecified trimester: Secondary | ICD-10-CM

## 2022-12-30 DIAGNOSIS — O99013 Anemia complicating pregnancy, third trimester: Secondary | ICD-10-CM

## 2022-12-30 DIAGNOSIS — Z3A38 38 weeks gestation of pregnancy: Secondary | ICD-10-CM

## 2022-12-30 DIAGNOSIS — D563 Thalassemia minor: Secondary | ICD-10-CM | POA: Diagnosis not present

## 2022-12-30 DIAGNOSIS — O10913 Unspecified pre-existing hypertension complicating pregnancy, third trimester: Secondary | ICD-10-CM

## 2022-12-30 DIAGNOSIS — O0990 Supervision of high risk pregnancy, unspecified, unspecified trimester: Secondary | ICD-10-CM | POA: Diagnosis not present

## 2022-12-30 NOTE — Telephone Encounter (Signed)
Preadmission screen  

## 2023-01-02 NOTE — Progress Notes (Signed)
   PRENATAL VISIT NOTE  Subjective:  Diana Robinson is a 22 y.o. G1P0000 at [redacted]w[redacted]d being seen today for ongoing prenatal care.  She is currently monitored for the following issues for this high-risk pregnancy and has Frequent headaches; Supervision of high risk pregnancy, antepartum; Murmur; Chronic hypertension in pregnancy; LGSIL on Pap smear of cervix; Alpha thalassemia silent carrier; and Anemia in pregnancy, third trimester on their problem list.  Patient reports no complaints.  Contractions: Not present. Vag. Bleeding: None.  Movement: Present. Denies leaking of fluid.   The following portions of the patient's history were reviewed and updated as appropriate: allergies, current medications, past family history, past medical history, past social history, past surgical history and problem list.   Objective:   Vitals:   12/30/22 1133  BP: 134/82  Pulse: (!) 101  Weight: 200 lb 3.2 oz (90.8 kg)    Fetal Status: Fetal Heart Rate (bpm): 145 Fundal Height: 37 cm Movement: Present  Presentation: Vertex  General:  Alert, oriented and cooperative. Patient is in no acute distress.  Skin: Skin is warm and dry. No rash noted.   Cardiovascular: Normal heart rate noted  Respiratory: Normal respiratory effort, no problems with respiration noted  Abdomen: Soft, gravid, appropriate for gestational age.  Pain/Pressure: Present     Pelvic: Cervical exam performed in the presence of a chaperone Dilation: 4 Effacement (%): 80 Station: -3  Extremities: Normal range of motion.  Edema: Trace  Mental Status: Normal mood and affect. Normal behavior. Normal judgment and thought content.   Assessment and Plan:  Pregnancy: G1P0000 at [redacted]w[redacted]d 1. Supervision of high risk pregnancy, antepartum - on PNV and baby ASA - recheck 1 week - pt is scheduled for induction at 39 weeks.  She does not desire induction sooner  2. [redacted] weeks gestation of pregnancy  3. Alpha thalassemia silent carrier  4. Anemia in  pregnancy, third trimester - on iron daily  5. Chronic hypertension in pregnancy - on procardia XL 60mg  daily - having weekly monitoring - having q 4 week growth scans  6. LGSIL on Pap smear of cervix - will be followed with postpartum pap smear  Term labor symptoms and general obstetric precautions including but not limited to vaginal bleeding, contractions, leaking of fluid and fetal movement were reviewed in detail with the patient. Please refer to After Visit Summary for other counseling recommendations.   No follow-ups on file.  Future Appointments  Date Time Provider Department Center  01/05/2023  7:15 AM [facility] SCHED ROOM [facility] None  01/06/2023 10:35 AM Jerene Bears, MD [facility]  01/13/2023 10:15 AM Jerene Bears, MD [facility]  02/24/2023  9:20 AM Thomasene Ripple, DO [facility] None  06/23/2023  3:00 PM Ivonne Andrew, NP [facility] None    Jerene Bears, MD

## 2023-01-04 ENCOUNTER — Other Ambulatory Visit: Payer: Self-pay | Admitting: Family Medicine

## 2023-01-04 ENCOUNTER — Other Ambulatory Visit: Payer: Self-pay | Admitting: Advanced Practice Midwife

## 2023-01-04 DIAGNOSIS — O10919 Unspecified pre-existing hypertension complicating pregnancy, unspecified trimester: Secondary | ICD-10-CM

## 2023-01-05 ENCOUNTER — Inpatient Hospital Stay (HOSPITAL_COMMUNITY)
Admission: AD | Admit: 2023-01-05 | Discharge: 2023-01-08 | DRG: 807 | Disposition: A | Discharge status: Home / Self Care | Payer: Medicaid Other | Attending: Family Medicine | Admitting: Family Medicine

## 2023-01-05 ENCOUNTER — Inpatient Hospital Stay (HOSPITAL_COMMUNITY): Payer: Medicaid Other

## 2023-01-05 ENCOUNTER — Encounter (HOSPITAL_COMMUNITY): Payer: Self-pay | Admitting: Family Medicine

## 2023-01-05 DIAGNOSIS — Z3490 Encounter for supervision of normal pregnancy, unspecified, unspecified trimester: Secondary | ICD-10-CM | POA: Diagnosis present

## 2023-01-05 DIAGNOSIS — Z7982 Long term (current) use of aspirin: Secondary | ICD-10-CM

## 2023-01-05 DIAGNOSIS — O26893 Other specified pregnancy related conditions, third trimester: Secondary | ICD-10-CM | POA: Diagnosis present

## 2023-01-05 DIAGNOSIS — O1092 Unspecified pre-existing hypertension complicating childbirth: Principal | ICD-10-CM | POA: Diagnosis present

## 2023-01-05 DIAGNOSIS — R87612 Low grade squamous intraepithelial lesion on cytologic smear of cervix (LGSIL): Secondary | ICD-10-CM | POA: Diagnosis present

## 2023-01-05 DIAGNOSIS — O9902 Anemia complicating childbirth: Secondary | ICD-10-CM | POA: Diagnosis present

## 2023-01-05 DIAGNOSIS — O1002 Pre-existing essential hypertension complicating childbirth: Secondary | ICD-10-CM | POA: Diagnosis not present

## 2023-01-05 DIAGNOSIS — O99013 Anemia complicating pregnancy, third trimester: Secondary | ICD-10-CM | POA: Diagnosis present

## 2023-01-05 DIAGNOSIS — Z3A39 39 weeks gestation of pregnancy: Secondary | ICD-10-CM | POA: Diagnosis not present

## 2023-01-05 DIAGNOSIS — O0990 Supervision of high risk pregnancy, unspecified, unspecified trimester: Secondary | ICD-10-CM

## 2023-01-05 DIAGNOSIS — Z148 Genetic carrier of other disease: Secondary | ICD-10-CM | POA: Diagnosis not present

## 2023-01-05 DIAGNOSIS — O10919 Unspecified pre-existing hypertension complicating pregnancy, unspecified trimester: Secondary | ICD-10-CM

## 2023-01-05 DIAGNOSIS — D563 Thalassemia minor: Secondary | ICD-10-CM | POA: Diagnosis present

## 2023-01-05 LAB — CBC
HCT: 35 % — ABNORMAL LOW (ref 36.0–46.0)
Hemoglobin: 11.4 g/dL — ABNORMAL LOW (ref 12.0–15.0)
MCH: 26.7 pg (ref 26.0–34.0)
MCHC: 32.6 g/dL (ref 30.0–36.0)
MCV: 82 fL (ref 80.0–100.0)
Platelets: 199 10*3/uL (ref 150–400)
RBC: 4.27 MIL/uL (ref 3.87–5.11)
RDW: 15.5 % (ref 11.5–15.5)
WBC: 5.8 10*3/uL (ref 4.0–10.5)
nRBC: 0 % (ref 0.0–0.2)

## 2023-01-05 LAB — RPR: RPR Ser Ql: NONREACTIVE

## 2023-01-05 LAB — TYPE AND SCREEN
ABO/RH(D): AB POS
Antibody Screen: NEGATIVE

## 2023-01-05 MED ORDER — NIFEDIPINE ER OSMOTIC RELEASE 30 MG PO TB24
60.0000 mg | ORAL_TABLET | Freq: Every day | ORAL | Status: DC
  Administered 2023-01-06 – 2023-01-08 (×3): 60 mg via ORAL
  Filled 2023-01-05 (×4): qty 2

## 2023-01-05 MED ORDER — TERBUTALINE SULFATE 1 MG/ML IJ SOLN: 0.2500 mg | Freq: Once | INTRAMUSCULAR | Status: DC | PRN

## 2023-01-05 MED ORDER — ONDANSETRON HCL 4 MG/2ML IJ SOLN
4.0000 mg | Freq: Four times a day (QID) | INTRAMUSCULAR | Status: DC | PRN
  Administered 2023-01-06: 4 mg via INTRAVENOUS
  Filled 2023-01-05: qty 2

## 2023-01-05 MED ORDER — PHENYLEPHRINE 80 MCG/ML (10ML) SYRINGE FOR IV PUSH (FOR BLOOD PRESSURE SUPPORT): 80.0000 ug | PREFILLED_SYRINGE | INTRAVENOUS | Status: DC | PRN

## 2023-01-05 MED ORDER — FENTANYL-BUPIVACAINE-NACL 0.5-0.125-0.9 MG/250ML-% EP SOLN
12.0000 mL/h | EPIDURAL | Status: DC | PRN
  Filled 2023-01-05: qty 250

## 2023-01-05 MED ORDER — OXYTOCIN-SODIUM CHLORIDE 30-0.9 UT/500ML-% IV SOLN
1.0000 m[IU]/min | INTRAVENOUS | Status: DC
  Administered 2023-01-05: 2 m[IU]/min via INTRAVENOUS

## 2023-01-05 MED ORDER — MISOPROSTOL 50MCG HALF TABLET: 50.0000 ug | ORAL_TABLET | Freq: Once | ORAL | Status: DC

## 2023-01-05 MED ORDER — OXYTOCIN BOLUS FROM INFUSION: 333.0000 mL | Freq: Once | INTRAVENOUS | Status: DC

## 2023-01-05 MED ORDER — SOD CITRATE-CITRIC ACID 500-334 MG/5ML PO SOLN: 30.0000 mL | ORAL | Status: DC | PRN

## 2023-01-05 MED ORDER — EPHEDRINE 5 MG/ML INJ: 10.0000 mg | INTRAVENOUS | Status: DC | PRN

## 2023-01-05 MED ORDER — ACETAMINOPHEN 325 MG PO TABS: 650.0000 mg | ORAL_TABLET | ORAL | Status: DC | PRN

## 2023-01-05 MED ORDER — LACTATED RINGERS IV SOLN: 500.0000 mL | Freq: Once | INTRAVENOUS | Status: DC

## 2023-01-05 MED ORDER — LACTATED RINGERS IV SOLN: INTRAVENOUS | Status: DC

## 2023-01-05 MED ORDER — DIPHENHYDRAMINE HCL 50 MG/ML IJ SOLN: 12.5000 mg | INTRAMUSCULAR | Status: DC | PRN

## 2023-01-05 MED ORDER — LIDOCAINE HCL (PF) 1 % IJ SOLN
30.0000 mL | INTRAMUSCULAR | Status: AC | PRN
  Administered 2023-01-06: 30 mL via SUBCUTANEOUS
  Filled 2023-01-05: qty 30

## 2023-01-05 MED ORDER — OXYTOCIN-SODIUM CHLORIDE 30-0.9 UT/500ML-% IV SOLN
2.5000 [IU]/h | INTRAVENOUS | Status: DC
  Filled 2023-01-05: qty 500

## 2023-01-05 MED ORDER — MISOPROSTOL 25 MCG QUARTER TABLET: 25.0000 ug | ORAL_TABLET | Freq: Once | ORAL | Status: DC

## 2023-01-05 MED ORDER — OXYCODONE-ACETAMINOPHEN 5-325 MG PO TABS: 2.0000 | ORAL_TABLET | ORAL | Status: DC | PRN

## 2023-01-05 MED ORDER — OXYCODONE-ACETAMINOPHEN 5-325 MG PO TABS: 1.0000 | ORAL_TABLET | ORAL | Status: DC | PRN

## 2023-01-05 MED ORDER — LACTATED RINGERS IV SOLN: 500.0000 mL | INTRAVENOUS | Status: DC | PRN

## 2023-01-05 NOTE — Progress Notes (Deleted)
cw

## 2023-01-05 NOTE — Progress Notes (Signed)
Diana Robinson is a 22 y.o. G1P0000 at [redacted]w[redacted]d admitted for induction of labor due to Elective at term.  Subjective: Diana Robinson is doing well. Reports mild cramping.  Objective: BP 115/77   Pulse 76   Temp 97.9 F (36.6 C) (Oral)   Resp 16   Ht 5\' 6"  (1.676 m)   Wt 89.8 kg   LMP 04/06/2022 (Approximate)   BMI 31.94 kg/m  No intake/output data recorded. No intake/output data recorded.  FHT: 135 bpm, moderate variability, +15x15 accels, no decels UC: Q 2-36mins SVE:   Dilation: 5 Effacement (%): 80 Station: -3 Exam by:: Camelia Eng CNM  Labs: Lab Results  Component Value Date   WBC 5.8 01/05/2023   HGB 11.4 (L) 01/05/2023   HCT 35.0 (L) 01/05/2023   MCV 82.0 01/05/2023   PLT 199 01/05/2023    Assessment / Plan: Diana Robinson is a 22 y.o. G1P0 at [redacted]w[redacted]d admitted for eIOL  Labor: Discussed AROM however fetal head ballotable. Will continue to titrate Pitocin prn. Encouraged patient to ambulate and use birthing ball to engage fetal head. AROM when fetal head descends Fetal Wellbeing:  Category I Pain Control:  prn per patient request I/D:  GBS neg Anticipated MOD:  NSVD  Brand Males, CNM 01/05/2023, 2:17 PM

## 2023-01-05 NOTE — Progress Notes (Addendum)
Labor Progress Note Diana Robinson is a 22 y.o. G1P0000 at [redacted]w[redacted]d presented for IOL  S: No acute concerns.   O:  BP 102/75   Pulse 72   Temp 98.1 F (36.7 C) (Oral)   Resp 16   Ht 5\' 6"  (1.676 m)   Wt 89.8 kg   LMP 04/06/2022 (Approximate)   BMI 31.94 kg/m  EFM: 140/mod variability/+accels/no decels  CVE: Dilation: 5.5 Effacement (%): 80 Cervical Position: Posterior Station: -2 Presentation: Vertex Exam by:: [place] Turner RN   A&P: 22 y.o. G1P0000 [redacted]w[redacted]d  IOL c/b chronic HTN #Labor: Progressing well. S/p AROM. Continue pit per protocol.  #Pain: Labor support, desires no medications. IV pain meds ordered if desires.  #FWB: cat 1 #GBS negative #cHTN: BPs normotensive to mild range. Asx. Continue daily Procardia 60 mg.   Olena Mater, DO 10:02 PM

## 2023-01-05 NOTE — Progress Notes (Addendum)
Diana Robinson is a 22 y.o. G1P0000 at [redacted]w[redacted]d by ultrasound admitted for induction of labor due to cHTN  Subjective: - patient coping well with labor, not requesting pain meds at this time - has been resting -well supported by partner  Objective: BP 131/82   Pulse 92   Temp 98 F (36.7 C) (Oral)   Resp 16   Ht 5\' 6"  (1.676 m)   Wt 89.8 kg   LMP 04/06/2022 (Approximate)   BMI 31.94 kg/m  No intake/output data recorded. No intake/output data recorded.  FHT:  FHR: 135 bpm, variability: moderate,  accelerations:  Present,  decelerations:  Absent UC:   regular, every 1.5-2 minutes SVE:   Dilation: 5.5 Effacement (%): 80 Station: -2 Exam by:: Camelia Eng, CNM AROM: scant amount of clear fluid  Labs: Lab Results  Component Value Date   WBC 5.8 01/05/2023   HGB 11.4 (L) 01/05/2023   HCT 35.0 (L) 01/05/2023   MCV 82.0 01/05/2023   PLT 199 01/05/2023    Assessment / Plan: Induction of labor due to gestational hypertension,  progressing well on pitocin, AROM- scant amount of clear fluid  Labor: progressing well on pitocin, will continue to increase Preeclampsia:   none Fetal Wellbeing:  Category I Pain Control:  Labor support without medications I/D:   GBS neg Anticipated MOD:  NSVD  Diana Robinson, Student-MidWife 01/05/2023, 6:47 PM   Attestation of CNM Supervision of Midwife Student: Evaluation and management procedures were performed by the midwife student under my supervision. I was immediately available for direct supervision, assistance and direction throughout this encounter.  I also confirm that I have verified the information documented in the resident's note, and that I have also personally reperformed the pertinent components of the physical exam and all of the medical decision making activities.  I have also made any necessary editorial changes.  Discussed risks/benefits of AROM. Patient given verbal consent. AROM with small amount of clear fluid. Patient  and fetus tolerated well.   Brand Males, CNM 01/05/2023 6:56 PM

## 2023-01-05 NOTE — H&P (Signed)
OBSTETRIC ADMISSION HISTORY AND PHYSICAL  Diana Robinson is a 22 y.o. female G1P0000 with IUP at [redacted]w[redacted]d by LMP presenting for IOL at term. She reports +FMs, No LOF, no VB, no blurry vision, headaches or peripheral edema, and RUQ pain.  She plans on breast feeding. She is undecided for birth control. She received her prenatal care at [facility]   Dating: By LMP --->  Estimated Date of Delivery: 01/11/23  Sono:    @[redacted]w[redacted]d , CWD, normal anatomy, cephalic presentation, 3137g, 16% EFW   Prenatal History/Complications:        Nursing Staff Provider  Office Location [facility] Dating  LMP c/w u/s  [facility] Model [x]  Traditional [ ]  Centering [ ]  Mom-Baby Dyad Anatomy US  Scheduled 08/17/22--nl  Language  English      Flu Vaccine  declined Genetic/Carrier Screen  NIPS:  LR/ Female AFP:   screen neg Horizon: alpha thal carrier  TDaP Vaccine  10/11/22 Hgb A1C or  GTT Early  Third trimester: Glucose, Fasting 70 - 91 mg/dL 91  Glucose, 1 hour 70 - 179 mg/dL 109  Glucose, 2 hour 70 - 152 mg/dL 604    COVID Vaccine Had JJ   LAB RESULTS   Rhogam     Blood Type  AB+  Baby Feeding Plan  Breast Antibody  Neg  Contraception  Unsure Rubella  IMMUNE  Circumcision  yes RPR   NR  Pediatrician  [facility] HBsAg   negative  Support Person Arva Chafe HCVAb   neg  Prenatal Classes  Info given HIV    Negative  BTL Consent NA GBS  Negative  VBAC Consent NA Pap      Diagnosis  Date Value Ref Range Status  06/15/2022 - Low grade squamous intraepithelial lesion (LSIL) (A)   Final             DME Rx [x]  BP cuff [ ]  Weight Scale Waterbirth  [ ]  Class [ ]  Consent [ ]  CNM visit  PHQ9 & GAD7 [x]  new OB [x]  28 weeks  [ x ] 36 weeks Induction  [ ]  Orders Entered [ ] Foley Y/N    Past Medical History: Past Medical History:  Diagnosis Date   Heart murmur    Hypertension    Migraine 05/2019   Vitamin D deficiency 10/2020    Past Surgical History: Past Surgical History:  Procedure Laterality Date    WISDOM TOOTH EXTRACTION      Obstetrical History: OB History     Gravida  1   Para  0   Term  0   Preterm  0   AB  0   Living  0      SAB  0   IAB  0   Ectopic  0   Multiple  0   Live Births  0           Social History Social History   Socioeconomic History   Marital status: Single    Spouse name: Not on file   Number of children: Not on file   Years of education: Not on file   Highest education level: Not on file  Occupational History   Not on file  Tobacco Use   Smoking status: Never   Smokeless tobacco: Never  Vaping Use   Vaping Use: Never used  Substance and Sexual Activity   Alcohol use: Never   Drug use: Never   Sexual activity: Yes    Birth control/protection: None  Other  Topics Concern   Not on file  Social History Narrative   Not on file   Social Determinants of Health   Financial Resource Strain: Low Risk  (05/31/2022)   Overall Financial Resource Strain (CARDIA)    Difficulty of Paying Living Expenses: Not hard at all  Food Insecurity: No Food Insecurity (01/05/2023)   Hunger Vital Sign    Worried About Running Out of Food in the Last Year: Never true    Ran Out of Food in the Last Year: Never true  Transportation Needs: No Transportation Needs (01/05/2023)   PRAPARE - Administrator, Civil Service (Medical): No    Lack of Transportation (Non-Medical): No  Physical Activity: Inactive (05/31/2022)   Exercise Vital Sign    Days of Exercise per Week: 0 days    Minutes of Exercise per Session: 0 min  Stress: No Stress Concern Present (05/31/2022)   [employer] of Occupational Health - Occupational Stress Questionnaire    Feeling of Stress : Not at all  Social Connections: Moderately Integrated (05/31/2022)   Social Connection and Isolation Panel [NHANES]    Frequency of Communication with Friends and Family: Twice a week    Frequency of Social Gatherings with Friends and Family: Once a week    Attends  Religious Services: More than 4 times per year    Active Member of [employer] or Organizations: No    Attends Engineer, structural: Never    Marital Status: Living with partner    Family History: Family History  Problem Relation Age of Onset   Hypertension Mother    Healthy Mother    Healthy Father    Asthma Neg Hx    Cancer Neg Hx    Diabetes Neg Hx    Heart disease Neg Hx     Allergies: No Known Allergies  Medications Prior to Admission  Medication Sig Dispense Refill Last Dose   aspirin 81 MG chewable tablet Chew 1 tablet (81 mg total) by mouth daily. 30 tablet 9    ferrous sulfate 325 (65 FE) MG tablet Take 1 tablet (325 mg total) by mouth daily with breakfast.  3    NIFEdipine (PROCARDIA XL) 60 MG 24 hr tablet Take 1 tablet (60 mg total) by mouth daily. 30 tablet 6    omeprazole (PRILOSEC) 40 MG capsule Take 1 capsule (40 mg total) by mouth daily. 30 capsule 2    Prenatal Vit-Fe Fumarate-FA (PRENATAL VITAMIN) 27-0.8 MG TABS Take 1 tablet by mouth daily. 30 tablet     Review of Systems   All systems reviewed and negative except as stated in HPI  Blood pressure 127/77, pulse 88, temperature 98.4 F (36.9 C), temperature source Oral, resp. rate 16, height 5\' 6"  (1.676 m), weight 89.8 kg, last menstrual period 04/06/2022. General appearance: alert and no distress Lungs: effort normal Heart: regular rate Abdomen: soft, non-tender; gravid Extremities: no sign of DVT Presentation: cephalic per RN exam Fetal monitoring: 145 bpm, moderate variability, +15x15 accels, no decels Uterine activity: Q2-3 mins Dilation: 4.5 Effacement (%): 80 Station: -3 Exam by:: Darrin Luis RN   Prenatal labs: ABO, Rh: --/--/PENDING (05/29 0725) Antibody: NEG (05/29 0725) Rubella: 32.30 (10/23 1019) RPR: Non Reactive (03/04 0849)  HBsAg: Negative (10/23 1019)  HIV: Non Reactive (03/04 0849)  GBS: Negative/-- (05/09 0156)  1 hr Glucola 91, 125, 114 Genetic screening  NIPS:  LR/ Female; AFP: screen neg; Horizon: alpha thal carrier Anatomy US: normal  Prenatal Transfer Tool  Maternal Diabetes: No Genetic Screening: Abnormal:  Results: Other: Horizon: alpha thal carrier Maternal Ultrasounds/Referrals: Normal Fetal Ultrasounds or other Referrals:  None Maternal Substance Abuse:  No Significant Maternal Medications:  Meds include: Other: Procardia Significant Maternal Lab Results:  Group B Strep negative Number of Prenatal Visits:greater than 3 verified prenatal visits Other Comments:  None  Results for orders placed or performed during the hospital encounter of 01/05/23 (from the past 24 hour(s))  CBC   Collection Time: 01/05/23  7:25 AM  Result Value Ref Range   WBC 5.8 4.0 - 10.5 K/uL   RBC 4.27 3.87 - 5.11 MIL/uL   Hemoglobin 11.4 (L) 12.0 - 15.0 g/dL   HCT 16.1 (L) 09.6 - 04.5 %   MCV 82.0 80.0 - 100.0 fL   MCH 26.7 26.0 - 34.0 pg   MCHC 32.6 30.0 - 36.0 g/dL   RDW 40.9 81.1 - 91.4 %   Platelets 199 150 - 400 K/uL   nRBC 0.0 0.0 - 0.2 %  Type and screen   Collection Time: 01/05/23  7:25 AM  Result Value Ref Range   ABO/RH(D) PENDING    Antibody Screen NEG    Sample Expiration      01/08/2023,2359 Performed at [facility], [address] [place], [place] [address]     Patient Active Problem List   Diagnosis Date Noted   Encounter for induction of labor 01/05/2023   Anemia in pregnancy, third trimester 10/13/2022   LGSIL on Pap smear of cervix 07/16/2022   Alpha thalassemia silent carrier 07/16/2022   Murmur 07/05/2022   Chronic hypertension in pregnancy 07/05/2022   Supervision of high risk pregnancy, antepartum 05/31/2022   Frequent headaches 05/26/2019    Assessment/Plan:  Madigan Hellums is a 22 y.o. G1P0000 at [redacted]w[redacted]d here for term IOL  #Labor: Start low dose Pitocin and titrate prn per unit policy. AROM when appropriate #cHTN: BPs normotensive. Labs normal. Continue Procardia.  #Pain: prn per patient request #FWB: Cat  1 #ID: GBS neg #MOF: breast #MOC: unsure #Circ:  yes  Brand Males, CNM  01/05/2023, 10:14 AM

## 2023-01-05 NOTE — Progress Notes (Incomplete)
Labor Progress Note Diana Robinson is a 22 y.o. G1P0000 at [redacted]w[redacted]d presented for *** S: No acute concerns.   O:  BP 102/75   Pulse 72   Temp 98.1 F (36.7 C) (Oral)   Resp 16   Ht 5\' 6"  (1.676 m)   Wt 89.8 kg   LMP 04/06/2022 (Approximate)   BMI 31.94 kg/m  EFM: 140/mod variability/+accels/no decels  CVE: Dilation: 5.5 Effacement (%): 80 Cervical Position: Posterior Station: -2 Presentation: Vertex Exam by:: [place] Turner RN   A&P: 22 y.o. G1P0000 [redacted]w[redacted]d *** #Labor: Progressing well. *** #Pain:  #FWB: *** #GBS {pos/neg/not [id number]} *** (chronic/other problems)  Olena Mater, DO 10:02 PM

## 2023-01-06 ENCOUNTER — Encounter (HOSPITAL_COMMUNITY): Payer: Self-pay | Admitting: Anesthesiology

## 2023-01-06 ENCOUNTER — Encounter (HOSPITAL_BASED_OUTPATIENT_CLINIC_OR_DEPARTMENT_OTHER): Payer: Medicaid Other | Admitting: Obstetrics & Gynecology

## 2023-01-06 ENCOUNTER — Encounter (HOSPITAL_COMMUNITY): Payer: Self-pay | Admitting: Family Medicine

## 2023-01-06 DIAGNOSIS — O1002 Pre-existing essential hypertension complicating childbirth: Secondary | ICD-10-CM

## 2023-01-06 DIAGNOSIS — Z3A39 39 weeks gestation of pregnancy: Secondary | ICD-10-CM

## 2023-01-06 LAB — CBC
HCT: 34.4 % — ABNORMAL LOW (ref 36.0–46.0)
Hemoglobin: 11.6 g/dL — ABNORMAL LOW (ref 12.0–15.0)
MCH: 27.1 pg (ref 26.0–34.0)
MCHC: 33.7 g/dL (ref 30.0–36.0)
MCV: 80.4 fL (ref 80.0–100.0)
Platelets: 195 10*3/uL (ref 150–400)
RBC: 4.28 MIL/uL (ref 3.87–5.11)
RDW: 15.5 % (ref 11.5–15.5)
WBC: 7.3 10*3/uL (ref 4.0–10.5)
nRBC: 0 % (ref 0.0–0.2)

## 2023-01-06 MED ORDER — TETANUS-DIPHTH-ACELL PERTUSSIS 5-2.5-18.5 LF-MCG/0.5 IM SUSY: 0.5000 mL | PREFILLED_SYRINGE | Freq: Once | INTRAMUSCULAR | Status: DC

## 2023-01-06 MED ORDER — ACETAMINOPHEN 325 MG PO TABS: 650.0000 mg | ORAL_TABLET | ORAL | Status: DC | PRN

## 2023-01-06 MED ORDER — DIBUCAINE (PERIANAL) 1 % EX OINT: 1.0000 | TOPICAL_OINTMENT | CUTANEOUS | Status: DC | PRN

## 2023-01-06 MED ORDER — ONDANSETRON HCL 4 MG/2ML IJ SOLN: 4.0000 mg | INTRAMUSCULAR | Status: DC | PRN

## 2023-01-06 MED ORDER — PRENATAL MULTIVITAMIN CH
1.0000 | ORAL_TABLET | Freq: Every day | ORAL | Status: DC
  Administered 2023-01-06 – 2023-01-08 (×3): 1 via ORAL
  Filled 2023-01-06 (×3): qty 1

## 2023-01-06 MED ORDER — FENTANYL CITRATE (PF) 100 MCG/2ML IJ SOLN
100.0000 ug | INTRAMUSCULAR | Status: DC | PRN
  Administered 2023-01-06 (×2): 100 ug via INTRAVENOUS
  Filled 2023-01-06: qty 2

## 2023-01-06 MED ORDER — ONDANSETRON HCL 4 MG PO TABS: 4.0000 mg | ORAL_TABLET | ORAL | Status: DC | PRN

## 2023-01-06 MED ORDER — BENZOCAINE-MENTHOL 20-0.5 % EX AERO
1.0000 | INHALATION_SPRAY | CUTANEOUS | Status: DC | PRN
  Administered 2023-01-06: 1 via TOPICAL
  Filled 2023-01-06: qty 56

## 2023-01-06 MED ORDER — SENNOSIDES-DOCUSATE SODIUM 8.6-50 MG PO TABS
2.0000 | ORAL_TABLET | ORAL | Status: DC
  Administered 2023-01-06 – 2023-01-08 (×3): 2 via ORAL
  Filled 2023-01-06 (×3): qty 2

## 2023-01-06 MED ORDER — WITCH HAZEL-GLYCERIN EX PADS: 1.0000 | MEDICATED_PAD | CUTANEOUS | Status: DC | PRN

## 2023-01-06 MED ORDER — COCONUT OIL OIL: 1.0000 | TOPICAL_OIL | Status: DC | PRN

## 2023-01-06 MED ORDER — SIMETHICONE 80 MG PO CHEW: 80.0000 mg | CHEWABLE_TABLET | ORAL | Status: DC | PRN

## 2023-01-06 MED ORDER — DIPHENHYDRAMINE HCL 25 MG PO CAPS: 25.0000 mg | ORAL_CAPSULE | Freq: Four times a day (QID) | ORAL | Status: DC | PRN

## 2023-01-06 MED ORDER — FENTANYL CITRATE (PF) 100 MCG/2ML IJ SOLN
INTRAMUSCULAR | Status: AC
  Filled 2023-01-06: qty 2

## 2023-01-06 MED ORDER — OXYTOCIN 10 UNIT/ML IJ SOLN
INTRAMUSCULAR | Status: AC
  Administered 2023-01-06: 10 [IU]
  Filled 2023-01-06: qty 1

## 2023-01-06 MED ORDER — IBUPROFEN 600 MG PO TABS
600.0000 mg | ORAL_TABLET | Freq: Four times a day (QID) | ORAL | Status: DC
  Administered 2023-01-06 – 2023-01-08 (×9): 600 mg via ORAL
  Filled 2023-01-06 (×9): qty 1

## 2023-01-06 MED ORDER — ZOLPIDEM TARTRATE 5 MG PO TABS: 5.0000 mg | ORAL_TABLET | Freq: Every evening | ORAL | Status: DC | PRN

## 2023-01-06 NOTE — Lactation Note (Signed)
This note was copied from a baby's chart. Lactation Consultation Note  Patient Name: Diana Robinson [id number] Date: 01/06/2023 Age:23 hours Reason for consult: L&D Initial assessment;1st time breastfeeding  P1,  Assisted with latching with ease.  Lactation to follow up on MBU.    Maternal Data Does the patient have breastfeeding experience prior to this delivery?: No  Feeding Mother's Current Feeding Choice: Breast Milk  LATCH Score Latch: Grasps breast easily, tongue down, lips flanged, rhythmical sucking.  Audible Swallowing: A few with stimulation  Type of Nipple: Everted at rest and after stimulation  Comfort (Breast/Nipple): Soft / non-tender  Hold (Positioning): Assistance needed to correctly position infant at breast and maintain latch.  LATCH Score: 8   Interventions Interventions: Assisted with latch;Breast massage;Skin to skin;Support pillows;Education Consult Status Consult Status: Follow-up from L&D    Dahlia Byes [facility] 01/06/2023, 9:12 AM

## 2023-01-06 NOTE — Progress Notes (Signed)
Labor Progress Note Lyrae Mesnard is a 22 y.o. G1P0000 at [redacted]w[redacted]d presented for IOL  S: No acute concerns.  O:  BP 123/62   Pulse 83   Temp 98.2 F (36.8 C) (Oral)   Resp 20   Ht 5\' 6"  (1.676 m)   Wt 89.8 kg   LMP 04/06/2022 (Approximate)   BMI 31.94 kg/m  EFM: 130/mod variability/+accels/ intermittent earlies  CVE: Dilation: 5.5 Effacement (%): 80 Cervical Position: Posterior Station: -2 Presentation: Vertex Exam by:: Cam Hai CNM   A&P: 22 y.o. G1P0000 [redacted]w[redacted]d  IOL c/b chronic HTN #Labor: Plan pit break.  Ctx intensity increasing though pattern remains irregular with recent early variables. Light labor diet and shower then will resume pit in 3-4 hrs.  #Pain: Labor support. IV pain meds PRN per patient request #FWB: cat 1 #GBS negative #cHTN: BPs normotensive to mild range. Asx. Procardia 60 mg daily.   Olena Mater, DO 5:43 AM

## 2023-01-06 NOTE — Progress Notes (Signed)
Labor Progress Note Diana Robinson is a 22 y.o. G1P0000 at [redacted]w[redacted]d presented for IOL  S: No acute concerns.   O:  BP 123/82   Pulse 79   Temp 98 F (36.7 C) (Oral)   Resp 16   Ht 5\' 6"  (1.676 m)   Wt 89.8 kg   LMP 04/06/2022 (Approximate)   BMI 31.94 kg/m  EFM: 140/mod variability/+accels/no decels  CVE: Dilation: 5.5 Effacement (%): 80 Cervical Position: Posterior Station: -2 Presentation: Vertex Exam by:: [place] Turner RN   A&P: 23 y.o. G1P0000 [redacted]w[redacted]d  IOL c/b chronic HTN #Labor: Progressing well. CVE unchanged from previous check. Ctx q2-5 mins. Continue pit per protocol.  #Pain: Labor support, desires no medications. IV pain meds ordered if desires.  #FWB: cat 1 #GBS negative #cHTN: BPs normotensive to mild range. Asx. Continue daily Procardia 60 mg.   Olena Mater, DO 1:58 AM

## 2023-01-06 NOTE — Anesthesia Preprocedure Evaluation (Deleted)
Anesthesia Evaluation    Airway        Dental   Pulmonary neg pulmonary ROS          Cardiovascular hypertension, Pt. on medications + Valvular Problems/Murmurs   Echo 07/28/22 1. Left ventricular ejection fraction, by estimation, is 60 to 65%. Left  ventricular ejection fraction by 3D volume is 67 %. The left ventricle has  normal function. The left ventricle has no regional wall motion  abnormalities. Left ventricular diastolic   parameters were normal. The average left ventricular global longitudinal  strain is -26.1 %. The global longitudinal strain is normal.   2. Right ventricular systolic function is normal. The right ventricular  size is normal. There is normal pulmonary artery systolic pressure.   3. The mitral valve is normal in structure. No evidence of mitral valve  regurgitation. No evidence of mitral stenosis.   4. The aortic valve is tricuspid. Aortic valve regurgitation is not  visualized. No aortic stenosis is present.   5. The inferior vena cava is normal in size with greater than 50%  respiratory variability, suggesting right atrial pressure of 3 mmHg.      Neuro/Psych  Headaches  negative psych ROS   GI/Hepatic Neg liver ROS,GERD  Medicated,,  Endo/Other  Obesity   Renal/GU negative Renal ROS  negative genitourinary   Musculoskeletal negative musculoskeletal ROS (+)    Abdominal   Peds  Hematology  (+) Blood dyscrasia, anemia   Anesthesia Other Findings   Reproductive/Obstetrics (+) Pregnancy                             Anesthesia Physical Anesthesia Plan  ASA: 3  Anesthesia Plan: Epidural   Post-op Pain Management:    Induction:   PONV Risk Score and Plan:   Airway Management Planned: Natural Airway  Additional Equipment:   Intra-op Plan:   Post-operative Plan:   Informed Consent:   Plan Discussed with: Anesthesiologist  Anesthesia Plan Comments:         Anesthesia Quick Evaluation

## 2023-01-06 NOTE — Discharge Summary (Signed)
Postpartum Discharge Summary    Patient Name: Diana Robinson DOB: 11-Nov-2000 MRN: [id number]  Date of admission: 01/05/2023 Delivery date:01/06/2023  Delivering provider: Celedonio Savage  Date of discharge: 01/08/2023  Admitting diagnosis: Encounter for induction of labor [Z34.90] Intrauterine pregnancy: [redacted]w[redacted]d     Secondary diagnosis:  Principal Problem:   Vaginal delivery Active Problems:   Supervision of high risk pregnancy, antepartum   Chronic hypertension in pregnancy   LGSIL on Pap smear of cervix   Alpha thalassemia silent carrier   Anemia in pregnancy, third trimester   Encounter for induction of labor  Additional problems: n/a    Discharge diagnosis: Term Pregnancy Delivered and CHTN                                              Post partum procedures: n/a Augmentation: AROM, Pit Complications: None  Hospital course: Induction of Labor With Vaginal Delivery   22 y.o. yo G1P0000 at [redacted]w[redacted]d was admitted to the hospital 01/05/2023 for induction of labor.  Indication for induction:  cHTN .  Patient had an labor course complicated byshort shoulder dystocia resolved with McRoberts and suprapubic pressure. Membrane Rupture Time/Date: 6:44 PM ,01/05/2023   Delivery Method:Vaginal, Spontaneous  Episiotomy: None  Lacerations:  2nd degree;Perineal  Details of delivery can be found in separate delivery note.  Patient had a postpartum course complicated by none, BP home meds include procardia and lasix. Patient is discharged home 01/08/23.  Newborn Data: Birth date:01/06/2023  Birth time:8:28 AM  Gender:Female  Living status:Living  Apgars:8 ,9  Weight:3870 g   Magnesium Sulfate received: No BMZ received: No Rhophylac:N/A MMR:N/A T-DaP:Given prenatally Flu: N/A Transfusion:No  Physical exam  Vitals:   01/07/23 0504 01/07/23 1521 01/07/23 2130 01/08/23 0532  BP: 110/67 117/74 119/72 120/72  Pulse: 92 91 95 80  Resp: 16 16 16 18   Temp: 98.1 F (36.7 C) 98.3 F (36.8 C) 98.1  F (36.7 C) 98.3 F (36.8 C)  TempSrc: Oral Oral Oral Oral  SpO2: 100% 100% 100%   Weight:      Height:       General: alert, cooperative, and no distress Lochia: appropriate Uterine Fundus: firm Incision: N/A DVT Evaluation: No evidence of DVT seen on physical exam. Labs: Lab Results  Component Value Date   WBC 7.3 01/06/2023   HGB 11.6 (L) 01/06/2023   HCT 34.4 (L) 01/06/2023   MCV 80.4 01/06/2023   PLT 195 01/06/2023      Latest Ref Rng & Units 06/15/2022    2:44 PM  CMP  Glucose 70 - 99 mg/dL 93   BUN 6 - 20 mg/dL 13   Creatinine 4.09 - 1.00 mg/dL 8.11   Sodium 914 - 782 mmol/L 137   Potassium 3.5 - 5.2 mmol/L 3.8   Chloride 96 - 106 mmol/L 102   CO2 20 - 29 mmol/L 19   Calcium 8.7 - 10.2 mg/dL 9.9   Total Protein 6.0 - 8.5 g/dL 7.5   Total Bilirubin 0.0 - 1.2 mg/dL 0.2   Alkaline Phos 44 - 121 IU/L 82   AST 0 - 40 IU/L 17   ALT 0 - 32 IU/L 9    Edinburgh Score:    01/07/2023   10:33 AM  Edinburgh Postnatal Depression Scale Screening Tool  I have been able to laugh and see the funny side of things. 0  I have looked forward with enjoyment to things. 0  I have blamed myself unnecessarily when things went wrong. 1  I have been anxious or worried for no good reason. 0  I have felt scared or panicky for no good reason. 1  Things have been getting on top of me. 1  I have been so unhappy that I have had difficulty sleeping. 0  I have felt sad or miserable. 0  I have been so unhappy that I have been crying. 0  The thought of harming myself has occurred to me. 0  Edinburgh Postnatal Depression Scale Total 3     After visit meds:  Allergies as of 01/08/2023   No Known Allergies      Medication List     STOP taking these medications    aspirin 81 MG chewable tablet   ferrous sulfate 325 (65 FE) MG tablet   omeprazole 40 MG capsule Commonly known as: PRILOSEC   Prenatal Vitamin 27-0.8 MG Tabs       TAKE these medications    acetaminophen 325 MG  tablet Commonly known as: Tylenol Take 2 tablets (650 mg total) by mouth every 4 (four) hours as needed (for pain scale < 4).   benzocaine-Menthol 20-0.5 % Aero Commonly known as: DERMOPLAST Apply 1 Application topically as needed for irritation (perineal discomfort).   furosemide 20 MG tablet Commonly known as: Lasix Take 1 tablet (20 mg total) by mouth daily for 5 days.   ibuprofen 600 MG tablet Commonly known as: ADVIL Take 1 tablet (600 mg total) by mouth every 6 (six) hours.   NIFEdipine 60 MG 24 hr tablet Commonly known as: Procardia XL Take 1 tablet (60 mg total) by mouth daily.   norethindrone 0.35 MG tablet Commonly known as: Ortho Micronor Take 1 tablet (0.35 mg total) by mouth daily.   senna-docusate 8.6-50 MG tablet Commonly known as: Senokot-S Take 2 tablets by mouth daily. Start taking on: January 09, 2023         Discharge home in stable condition Infant Feeding: Bottle and Breast Infant Disposition:home with mother Discharge instruction: per After Visit Summary and Postpartum booklet. Activity: Advance as tolerated. Pelvic rest for 6 weeks.  Diet: routine diet Future Appointments: Future Appointments  Date Time Provider Department Center  02/15/2023 11:15 AM Leftwich-Kirby, Wilmer Floor, CNM [facility]  02/24/2023  9:20 AM Tobb, Lavona Mound, DO [facility] None  06/23/2023  3:00 PM Ivonne Andrew, NP [facility] None   Follow up Visit:  Message sent  Please schedule this patient for a In person postpartum visit in 6 weeks with the following provider: Any provider. Additional Postpartum F/U:BP check 1 week  High risk pregnancy complicated by: HTN Delivery mode:  Vaginal, Spontaneous  Anticipated Birth Control:  Unsure   01/08/2023 Myrtie Hawk, DO

## 2023-01-06 NOTE — Lactation Note (Signed)
This note was copied from a baby's chart. Lactation Consultation Note  Patient Name: Boy Xander Semaan [id number] Date: 01/06/2023 Age:22 Reason for consult: Primapara;1st time breastfeeding;Initial assessment;Term;Breastfeeding assistance  The infant is at 100 hours old.  LC entered the room and the birth parent had just finished breastfeeding the infant.  Per the birth parent things are going well with breastfeeding.  She denies any pain with the latch.  LC educated the birth parent on:  Milk production Infant stomach size Supply and demand Cluster feeding How to tell if the infant is full Infant behavior & I/O All questions were answered.  Infant Feeding Plan:  Breastfeed 8+ times in 24 hours according to feeding cues.  Hand express and feed the expressed milk to the infant via a spoon.  Call RN/LC for assistance with breastfeeding.    Maternal Data Has patient been taught Hand Expression?: Yes Does the patient have breastfeeding experience prior to this delivery?: No  Feeding Mother's Current Feeding Choice: Breast Milk  LATCH Score                    Lactation Tools Discussed/Used    Interventions Interventions: Breast feeding basics reviewed;Education;LC Services brochure  Discharge Pump: DEBP;Personal  Consult Status Consult Status: Follow-up Date: 01/07/23 Follow-up type: In-patient    Delene Loll 01/06/2023, 5:41 PM

## 2023-01-06 NOTE — Progress Notes (Signed)
Patient ID: Diana Robinson, female   DOB: 2001-03-09, 22 y.o.   MRN: [id number]  Ctx are still the same; not becoming more uncomfortable  BPs 114/76, 106/53 FHR 130s, +accels, no decels Ctx 2-4 mins with Pit @ 66mu/min Cx difficult to reach, 5+/80/vtx -2, AROM of forebag for clear/pink fluid  IUP@39 .2wks IOL process cHTN  Now that membranes are fully ruptured, expect more progress Continue home dose of ProcardiaXL 60mg  later this morning Anticipate vag del  Arabella Merles CNM 01/06/2023

## 2023-01-07 NOTE — Progress Notes (Signed)
Circumcision Consent  Discussed with mom at bedside about circumcision.   Circumcision is a surgery that removes the skin that covers the tip of the penis, called the "foreskin." Circumcision is usually done when a boy is between 40 and 67 days old, sometimes up to 71-37 weeks old.  The most common reasons boys are circumcised include for cultural/religious beliefs or for parental preference (potentially easier to clean, so baby looks like daddy, etc).  There may be some medical benefits for circumcision:   Circumcised boys seem to have slightly lower rates of: ? Urinary tract infections (per the American Academy of Pediatrics an uncircumcised boy has a 1/100 chance of developing a UTI in the first year of life, a circumcised boy at a 08/998 chance of developing a UTI in the first year of life- a 10% reduction) ? Penis cancer (typically rare- an uncircumcised female has a 1 in 100,000 chance of developing cancer of the penis) ? Sexually transmitted infection (in endemic areas, including HIV, HPV and Herpes- circumcision does NOT protect against gonorrhea, chlamydia, trachomatis, or syphilis) ? Phimosis: a condition where that makes retraction of the foreskin over the glans impossible (0.4 per 1000 boys per year or 0.6% of boys are affected by their 15th birthday)  Boys and men who are not circumcised can reduce these extra risks by: ? Cleaning their penis well ? Using condoms during sex  What are the risks of circumcision?  As with any surgical procedure, there are risks and complications. In circumcision, complications are rare and usually minor, the most common being: ? Bleeding- risk is reduced by holding each clamp for 30 seconds prior to a cut being made, and by holding pressure after the procedure is done ? Infection- the penis is cleaned prior to the procedure, and the procedure is done under sterile technique ? Damage to the urethra or amputation of the penis  How is circumcision done  in baby boys?  The baby will be placed on a special table and the legs restrained for their safety. Numbing medication is injected into the penis, and the skin is cleansed with betadine to decrease the risk of infection.   What to expect:  The penis will look red and raw for 5-7 days as it heals. We expect scabbing around where the cut was made, as well as clear-pink fluid and some swelling of the penis right after the procedure. If your baby's circumcision starts to bleed or develops pus, please contact your pediatrician immediately.  All questions were answered and mother consented.  Celedonio Savage, MD 2:09 PM

## 2023-01-07 NOTE — Progress Notes (Addendum)
POSTPARTUM PROGRESS NOTE  Post Partum Day 1   Subjective:  Diana Robinson is a 22 y.o. G1P1001 s/p VD at [redacted]w[redacted]d.  She reports she is doing well. No acute events overnight. She denies any problems with ambulating, voiding or po intake. Denies nausea or vomiting.  Pain is well controlled. Reports perineal discomfort but supportive measures are helping. Lochia is appropriate.  Objective: Blood pressure 110/67, pulse 92, temperature 98.1 F (36.7 C), temperature source Oral, resp. rate 16, height 5\' 6"  (1.676 m), weight 89.8 kg, last menstrual period 04/06/2022, SpO2 100 %, unknown if currently breastfeeding.  Physical Exam:  General: alert, cooperative and no distress Chest: no respiratory distress Heart:regular rate, distal pulses intact Abdomen: soft, nontender,  Uterine Fundus: firm, appropriately tender DVT Evaluation: No calf swelling or tenderness Extremities: No edema Skin: warm, dry  Recent Labs    01/05/23 0725 01/06/23 0648  HGB 11.4* 11.6*  HCT 35.0* 34.4*    Assessment/Plan: Diana Robinson is a 22 y.o. G1P1001 s/p VD at [redacted]w[redacted]d   PPD#1 - Doing well  Routine postpartum care  cHTN: BPs normotensive since delivery. Asx. Continue home Procardia 60 mg daily.  Contraception: still undecided, likely will wait for pp f/up Feeding: Breast, going well  Dispo: Plan for discharge today or tomorrow.   LOS: 2 days   Thom Chimes, D.O. 01/07/2023, 7:12 AM  Fellow Attestation  I saw and evaluated the patient, performing the key elements of the service.I  personally performed or re-performed the history, physical exam, and medical decision making activities of this service and have verified that the service and findings are accurately documented in the resident's note. I developed the management plan that is described in the resident's note, and I agree with the content, with my edits above.    Derrel Nip, MD OB Fellow

## 2023-01-08 ENCOUNTER — Other Ambulatory Visit (HOSPITAL_COMMUNITY): Payer: Self-pay

## 2023-01-08 MED ORDER — BENZOCAINE-MENTHOL 20-0.5 % EX AERO
1.0000 | INHALATION_SPRAY | CUTANEOUS | 0 refills | Status: DC | PRN
  Filled 2023-01-08: qty 78, 30d supply, fill #0

## 2023-01-08 MED ORDER — NORETHINDRONE 0.35 MG PO TABS
1.0000 | ORAL_TABLET | Freq: Every day | ORAL | 11 refills | Status: DC
  Filled 2023-01-08: qty 28, 28d supply, fill #0

## 2023-01-08 MED ORDER — SENNOSIDES-DOCUSATE SODIUM 8.6-50 MG PO TABS
2.0000 | ORAL_TABLET | ORAL | 0 refills | Status: DC
  Filled 2023-01-08: qty 30, 15d supply, fill #0

## 2023-01-08 MED ORDER — IBUPROFEN 600 MG PO TABS
600.0000 mg | ORAL_TABLET | Freq: Four times a day (QID) | ORAL | 0 refills | Status: DC
  Filled 2023-01-08: qty 30, 8d supply, fill #0

## 2023-01-08 MED ORDER — FUROSEMIDE 20 MG PO TABS
20.0000 mg | ORAL_TABLET | Freq: Every day | ORAL | 0 refills | Status: DC
  Filled 2023-01-08: qty 5, 5d supply, fill #0

## 2023-01-08 MED ORDER — ACETAMINOPHEN 325 MG PO TABS
650.0000 mg | ORAL_TABLET | ORAL | 0 refills | Status: DC | PRN
  Filled 2023-01-08: qty 100, 9d supply, fill #0

## 2023-01-08 NOTE — Lactation Note (Signed)
This note was copied from a baby's chart. Lactation Consultation Note  Patient Name: Diana Robinson Date: 01/08/2023 Age:22 years Reason for consult: Follow-up assessment;Primapara;1st time breastfeeding;Term  LC in to room prior to discharge. LC assisted with latch, LP denies any pain/discomfort. Observed a deep latch, optimal positioning and neck/back support.  Discussed normal behavior and patterns after 24h, voids and stools as signs good intake, pumping, clusterfeeding, skin to skin. Talked about milk coming into volume, noted transitional milk with hand expression, and managing engorgement.  Reviewed local resources available after discharge.   Plan: 1-Aim for a deep, comfortable latch, breastfeeding on demand or 8-12 times in 24h period. 2-Hand express/pump as needed for supplementation 3-Encouraged birthing parent rest, hydration and food intake.   Contact LC as needed for feeds/support/concerns/questions. All questions answered at this time.    Maternal Data Has patient been taught Hand Expression?: Yes Does the patient have breastfeeding experience prior to this delivery?: No  Feeding Mother's Current Feeding Choice: Breast Milk  LATCH Score Latch: Grasps breast easily, tongue down, lips flanged, rhythmical sucking.  Audible Swallowing: Spontaneous and intermittent  Type of Nipple: Everted at rest and after stimulation  Comfort (Breast/Nipple): Soft / non-tender  Hold (Positioning): Assistance needed to correctly position infant at breast and maintain latch.  LATCH Score: 9   Lactation Tools Discussed/Used Tools: Pump;Flanges Flange Size: 21 Breast pump type: Manual;Double-Electric Breast Pump Reason for Pumping: stimulation and supplementation Pumped volume: 4 mL (manual pump demonstration and flange fitting)  Interventions Interventions: Breast feeding basics reviewed;Assisted with latch;Skin to skin;Breast massage;Hand express;Breast  compression;Hand pump;Expressed milk;DEBP;Education  Discharge Discharge Education: Engorgement and breast care;Warning signs for feeding baby Pump: DEBP;Manual;Personal;Hands Free [facility] Program: Yes  Consult Status Consult Status: Complete Date: 01/08/23 Follow-up type: Call as needed    Jaidee Stipe A Higuera Ancidey 01/08/2023, 2:32 PM

## 2023-01-13 ENCOUNTER — Encounter (HOSPITAL_BASED_OUTPATIENT_CLINIC_OR_DEPARTMENT_OTHER): Payer: Medicaid Other | Admitting: Obstetrics & Gynecology

## 2023-01-14 ENCOUNTER — Telehealth (HOSPITAL_COMMUNITY): Payer: Self-pay | Admitting: *Deleted

## 2023-01-14 NOTE — Telephone Encounter (Signed)
Left phone voicemail message.  Duffy Rhody, RN 01-14-2023 at 12:26pm

## 2023-02-15 ENCOUNTER — Ambulatory Visit (HOSPITAL_BASED_OUTPATIENT_CLINIC_OR_DEPARTMENT_OTHER): Payer: Medicaid Other | Admitting: Advanced Practice Midwife

## 2023-02-23 ENCOUNTER — Telehealth (HOSPITAL_BASED_OUTPATIENT_CLINIC_OR_DEPARTMENT_OTHER): Payer: Self-pay | Admitting: *Deleted

## 2023-02-23 NOTE — Telephone Encounter (Signed)
LMOVM for pt to call and reschedule postpartum appt

## 2023-02-24 ENCOUNTER — Telehealth: Payer: Self-pay

## 2023-02-24 ENCOUNTER — Ambulatory Visit: Payer: Medicaid Other | Admitting: Cardiology

## 2023-02-24 NOTE — Telephone Encounter (Signed)
Called pt to get her ready for video visit no answer x3 vm was left

## 2023-02-24 NOTE — Telephone Encounter (Signed)
Called pt to let her know we can reschedule her appointment. Was going to over her 8:20 tomorrow or tomorrow afternoon. No answer, left message for her to return the call.

## 2023-03-01 ENCOUNTER — Ambulatory Visit (INDEPENDENT_AMBULATORY_CARE_PROVIDER_SITE_OTHER): Payer: Medicaid Other | Admitting: Advanced Practice Midwife

## 2023-03-01 DIAGNOSIS — Z1331 Encounter for screening for depression: Secondary | ICD-10-CM

## 2023-03-01 DIAGNOSIS — Z8759 Personal history of other complications of pregnancy, childbirth and the puerperium: Secondary | ICD-10-CM | POA: Diagnosis not present

## 2023-03-01 DIAGNOSIS — R87612 Low grade squamous intraepithelial lesion on cytologic smear of cervix (LGSIL): Secondary | ICD-10-CM

## 2023-03-01 DIAGNOSIS — O10919 Unspecified pre-existing hypertension complicating pregnancy, unspecified trimester: Secondary | ICD-10-CM

## 2023-03-01 NOTE — Progress Notes (Signed)
Post Partum Visit Note  Diana Robinson is a 22 y.o. G54P1001 female who presents for a postpartum visit. She is 7 weeks postpartum following a normal spontaneous vaginal delivery.  I have fully reviewed the prenatal and intrapartum course. The delivery was at 39 gestational weeks.  Anesthesia: none. Postpartum course has been normal. Baby is doing well. Baby is feeding by breast. Bleeding no bleeding. Bowel function is normal. Bladder function is normal. Patient is not sexually active. Contraception method is abstinence. Postpartum depression screening: negative.   The pregnancy intention screening data noted above was reviewed. Potential methods of contraception were discussed. The patient elected to proceed with No data recorded.   Edinburgh Postnatal Depression Scale - 03/01/23 1520       Edinburgh Postnatal Depression Scale:  In the Past 7 Days   I have been able to laugh and see the funny side of things. 0    I have looked forward with enjoyment to things. 0    I have blamed myself unnecessarily when things went wrong. 2    I have been anxious or worried for no good reason. 2    I have felt scared or panicky for no good reason. 1    Things have been getting on top of me. 1    I have been so unhappy that I have had difficulty sleeping. 0    I have felt sad or miserable. 2    I have been so unhappy that I have been crying. 1    The thought of harming myself has occurred to me. 0    Edinburgh Postnatal Depression Scale Total 9             Health Maintenance Due  Topic Date Due   HPV VACCINES (1 - 3-dose series) Never done   COVID-19 Vaccine (1 - 2023-24 season) Never done    The following portions of the patient's history were reviewed and updated as appropriate: allergies, current medications, past family history, past medical history, past social history, past surgical history, and problem list.  Review of Systems Pertinent items noted in HPI and remainder of  comprehensive ROS otherwise negative.  Objective:  BP (!) 145/89   Pulse 92   Ht 5\' 5"  (1.651 m)   Wt 172 lb 6.4 oz (78.2 kg)   LMP 03/23/2022 (Approximate)   Breastfeeding Yes   BMI 28.69 kg/m      VS reviewed, nursing note reviewed,  Constitutional: well developed, well nourished, no distress HEENT: normocephalic, thyroid without enlargement or mass HEART: RRR, no murmurs rubs/gallops RESP: clear and equal to auscultation bilaterally in all lobes  Breast Exam:Deferred Abdomen: soft Neuro: alert and oriented x 3 Skin: warm, dry Psych: affect normal Pelvic exam: Deferred     Assessment:   1. Postpartum care following vaginal delivery --Doing well, bonding well with baby, good support at home. --Some days are harder than others, yesterday baby cried all day and it was a hard day.  Today is better.  Reassurance provided and resources discussed if pt needs to seek help.   2. Chronic hypertension in pregnancy --BP elevated, retake wnl today --Continue Procardia 60 daily --F/U with PCP  3. LGSIL on Pap smear of cervix --Pt desires to reschedule Pap and come to the office without the baby. --Scheduled pap next week.    Plan:   Essential components of care per ACOG recommendations:  1.  Mood and well being: Patient with negative depression screening today. Reviewed  local resources for support.  - Patient tobacco use? No.   - hx of drug use? No.    2. Infant care and feeding:  -Patient currently breastmilk feeding? Yes. Reviewed importance of draining breast regularly to support lactation.  -Social determinants of health (SDOH) reviewed in EPIC. No concerns   3. Sexuality, contraception and birth spacing - Patient does not want a pregnancy in the next year.    - Reviewed reproductive life planning. Reviewed contraceptive methods based on pt preferences and effectiveness.  Patient desired Oral Contraceptive today.   - Discussed birth spacing of 18 months  4. Sleep  and fatigue -Encouraged family/partner/community support of 4 hrs of uninterrupted sleep to help with mood and fatigue  5. Physical Recovery  - Discussed patients delivery and complications. She describes her labor as good. - Patient had a Vaginal, no problems at delivery. Patient had a 2nd degree laceration. Perineal healing reviewed. Patient expressed understanding - Patient has urinary incontinence? No. - Patient is safe to resume physical and sexual activity  6.  Health Maintenance - HM due items addressed Yes - Last pap smear  Diagnosis  Date Value Ref Range Status  06/15/2022 - Low grade squamous intraepithelial lesion (LSIL) (A)  Final   Pap smear not done at today's visit.    -Breast Cancer screening indicated? No.   7. Chronic Disease/Pregnancy Condition follow up: Hypertension  - PCP follow up  Sharen Counter, CNM [facility] [employer], [facility]

## 2023-03-09 ENCOUNTER — Ambulatory Visit (HOSPITAL_BASED_OUTPATIENT_CLINIC_OR_DEPARTMENT_OTHER): Payer: Medicaid Other | Admitting: Student

## 2023-03-09 ENCOUNTER — Encounter (HOSPITAL_BASED_OUTPATIENT_CLINIC_OR_DEPARTMENT_OTHER): Payer: Self-pay

## 2023-03-16 ENCOUNTER — Ambulatory Visit (INDEPENDENT_AMBULATORY_CARE_PROVIDER_SITE_OTHER): Payer: Medicaid Other | Admitting: Medical

## 2023-03-16 ENCOUNTER — Encounter (HOSPITAL_BASED_OUTPATIENT_CLINIC_OR_DEPARTMENT_OTHER): Payer: Self-pay | Admitting: Medical

## 2023-03-16 ENCOUNTER — Telehealth (HOSPITAL_BASED_OUTPATIENT_CLINIC_OR_DEPARTMENT_OTHER): Payer: Self-pay | Admitting: *Deleted

## 2023-03-16 ENCOUNTER — Other Ambulatory Visit (HOSPITAL_COMMUNITY)
Admission: RE | Admit: 2023-03-16 | Discharge: 2023-03-16 | Disposition: A | Discharge status: Home / Self Care | Payer: Medicaid Other | Source: Ambulatory Visit | Attending: Medical | Admitting: Medical

## 2023-03-16 VITALS — BP 133/87 | HR 91 | Ht 66.0 in | Wt 171.4 lb

## 2023-03-16 DIAGNOSIS — Z124 Encounter for screening for malignant neoplasm of cervix: Secondary | ICD-10-CM

## 2023-03-16 DIAGNOSIS — R87612 Low grade squamous intraepithelial lesion on cytologic smear of cervix (LGSIL): Secondary | ICD-10-CM | POA: Insufficient documentation

## 2023-03-16 DIAGNOSIS — Z113 Encounter for screening for infections with a predominantly sexual mode of transmission: Secondary | ICD-10-CM

## 2023-03-16 LAB — OB RESULTS CONSOLE GC/CHLAMYDIA: Chlamydia: NEGATIVE

## 2023-03-16 NOTE — Telephone Encounter (Signed)
Called patient and left a message to call the office back to reschedule her  missed appointment .

## 2023-03-16 NOTE — Progress Notes (Unsigned)
  History:  [name] is a 22 y.o. G1P1001 who presents to clinic today for pap smear and STD testing. The patient denies any bleeding or abnormal discharge or pain. She had LSIL pap smear last year.    The following portions of the patient's history were reviewed and updated as appropriate: allergies, current medications, family history, past medical history, social history, past surgical history and problem list.  Review of Systems:  Review of Systems  Constitutional:  Negative for fever.  Gastrointestinal:  Negative for abdominal pain.  Genitourinary:        Neg - vaginal bleeding, discharge      Objective:  Physical Exam BP 133/87 (BP Location: Left Arm, Patient Position: Sitting, Cuff Size: Large)   Pulse 91   Ht 5\' 6"  (1.676 m) Comment: Reported  Wt 171 lb 6.4 oz (77.7 kg)   LMP 03/23/2022 (Approximate)   BMI 27.66 kg/m  Physical Exam Vitals reviewed. Exam conducted with a chaperone present.  Constitutional:      General: She is not in acute distress.    Appearance: Normal appearance. She is normal weight.  Cardiovascular:     Rate and Rhythm: Normal rate.  Pulmonary:     Effort: Pulmonary effort is normal.  Abdominal:     General: Abdomen is flat. There is no distension.     Palpations: Abdomen is soft. There is no mass.     Tenderness: There is no abdominal tenderness. There is no guarding or rebound.     Hernia: No hernia is present.  Genitourinary:    General: Normal vulva.     Vagina: No vaginal discharge, erythema, tenderness or bleeding.     Cervix: Normal.     Uterus: Normal.      Adnexa: Right adnexa normal and left adnexa normal.  Skin:    General: Skin is warm and dry.     Coloration: Skin is not pale.  Neurological:     Mental Status: She is alert and oriented to person, place, and time.  Psychiatric:        Mood and Affect: Mood normal.      Health Maintenance Due  Topic Date Due   HPV VACCINES (1 - 3-dose series) Never done    COVID-19 Vaccine (1 - 2023-24 season) Never done   INFLUENZA VACCINE  03/10/2023    Labs, imaging and previous visits in Epic and Care Everywhere reviewed  Assessment & Plan:  1. Cervical cancer screening - Cytology - PAP( [place])  2. Screen for STD (sexually transmitted disease) - Cytology - PAP( [place]) - HIV Antibody (routine testing w rflx) - [facility] - Hepatitis B Surface AntiGEN - Hepatitis C Antibody  Lab results will be available in MyChart   Return in about 1 year (around 03/15/2024) for Annual exam.  Diana Lowenstein, PA-C 03/17/2023 2:25 PM

## 2023-03-17 ENCOUNTER — Encounter (HOSPITAL_BASED_OUTPATIENT_CLINIC_OR_DEPARTMENT_OTHER): Payer: Self-pay | Admitting: Medical

## 2023-04-08 ENCOUNTER — Other Ambulatory Visit (HOSPITAL_BASED_OUTPATIENT_CLINIC_OR_DEPARTMENT_OTHER): Payer: Self-pay | Admitting: *Deleted

## 2023-04-08 DIAGNOSIS — O161 Unspecified maternal hypertension, first trimester: Secondary | ICD-10-CM

## 2023-04-08 DIAGNOSIS — O10919 Unspecified pre-existing hypertension complicating pregnancy, unspecified trimester: Secondary | ICD-10-CM

## 2023-04-08 MED ORDER — NIFEDIPINE ER OSMOTIC RELEASE 60 MG PO TB24
60.0000 mg | ORAL_TABLET | Freq: Every day | ORAL | 6 refills | Status: DC
Start: 2023-04-08 — End: 2023-10-11

## 2023-04-08 NOTE — Progress Notes (Signed)
Pt requests refill on nifedipine. Refill sent to pharmacy.

## 2023-04-29 ENCOUNTER — Ambulatory Visit (INDEPENDENT_AMBULATORY_CARE_PROVIDER_SITE_OTHER): Payer: Medicaid Other | Admitting: Nurse Practitioner

## 2023-04-29 VITALS — BP 133/81 | HR 89 | Temp 97.3°F | Resp 16 | Ht 66.0 in | Wt 202.0 lb

## 2023-04-29 DIAGNOSIS — F53 Postpartum depression: Secondary | ICD-10-CM

## 2023-04-29 MED ORDER — ESCITALOPRAM OXALATE 10 MG PO TABS
10.0000 mg | ORAL_TABLET | Freq: Every day | ORAL | 2 refills | Status: AC
Start: 2023-04-29 — End: ?

## 2023-04-29 NOTE — Patient Instructions (Addendum)
1. Post partum depression  - escitalopram (LEXAPRO) 10 MG tablet; Take 1 tablet (10 mg total) by mouth daily.  Dispense: 30 tablet; Refill: 2  Follow up:  Follow up in 1 month  Perinatal Depression When a woman feels excessive sadness, anger, or anxiety during pregnancy or during the first 12 months after she gives birth, she has a condition called perinatal depression. This can interfere with work, school, relationships, and other everyday activities. If it is not managed properly, it can also interfere with the woman's ability to take care of the baby. Symptoms of perinatal depression may feel worse when living with a newborn. Sometimes, these symptoms are left untreated because they are thought to be normal mood swings during and right after pregnancy. However, if you have intense symptoms of depression that last for more than 2 weeks, it is important to talk with your health care provider. This may be perinatal depression. What are the causes? The exact cause of this condition is not known. Hormonal changes during and after pregnancy may play a role in causing perinatal depression. What increases the risk? You are more likely to develop this condition if: You have a personal or family history of depression, anxiety, or mood disorders. You experience a stressful life event during pregnancy, such as the death of a loved one. You have additional life stress, such as being a single parent. You do not have support from family members or loved ones, or you are in an abusive relationship. You have thyroid problems. What are the signs or symptoms? Symptoms of this condition include: Emotional symptoms, such as: Feeling sad or hopeless. Feelings of guilt. Feeling irritable or overwhelmed. Physical symptoms, such as: Changes with appetite or sleep. Lack of energy or motivation. Persistent headaches or stomach problems. Behavioral symptoms, such as: Difficulty concentrating or completing  tasks. Loss of interest in hobbies or relationships. How is this diagnosed? This condition is diagnosed based on a physical exam and mental evaluation. In some cases, your health care provider may use a depression screening tool. This includes a list of questions that can help a health care provider diagnose depression. You may be referred to a mental health expert who specializes in treating perinatal depression. How is this treated? This condition may be treated with: Talk therapy with a mental health professional. This may be interpersonal psychotherapy, couples therapy, cognitive behavioral therapy, or mother-child bonding therapy. Medicines. Your health care provider will discuss the safety of the medicines prescribed during pregnancy and breastfeeding. Support groups. Brain stimulation or light therapies. Stress reduction therapies, such as mindfulness. Follow these instructions at home: Lifestyle Do not use any products that contain nicotine or tobacco. These products include cigarettes, chewing tobacco, and vaping devices, such as e-cigarettes. If you need help quitting, ask your health care provider. Do not drink alcohol when you are pregnant. It is also safest not to drink alcohol if you are breastfeeding. After your baby is born, if you drink alcohol: Limit how much you have to 0-1 drink a day. Be aware of how much alcohol is in your drink. In the [place] one drink equals one 12 oz bottle of beer (355 mL), one 5 oz glass of wine (148 mL), or one 1 oz glass of hard liquor (44 mL). Consider joining a support group for new mothers. Ask your health care provider for recommendations. Take good care of yourself. Make sure you: Get as much sleep as possible. Talk with your partner about sharing the responsibility of  getting up with your baby if possible and sharing child care responsibilities equally. Make sleep a priority. Eat a healthy diet. This includes plenty of fruits and vegetables,  whole grains, and lean proteins. Exercise regularly, as told by your health care provider. Ask your health care provider what exercises are safe for you. Talk with your partner about making sure you both have opportunities to exercise. General instructions Take over-the-counter and prescription medicines only as told by your health care provider. Talk with your partner or family members about your feelings during pregnancy. Share any concerns, needs, or anxieties that you may have. Do not be afraid to ask for help. Find a mental health professional, if needed. Ask for help with tasks or chores when you need it. Ask friends and family members to provide meals, watch your children, or help with cleaning. Keep all follow-up visits. This is important. Contact a health care provider if: You or people close to you notice that you have symptoms of depression. Your symptoms of depression get worse. You take medicines and have side effects, such as nausea or sleep problems. Get help right away if: You feel like hurting yourself, your baby, or someone else. If you feel like you may hurt yourself or others, or have thoughts about taking your own life, get help right away. You can go to your nearest emergency department or: Call your local emergency services (911 in the [place] Call a suicide crisis helpline, such as the National Suicide Prevention Lifeline, at (684)574-2868 or 988 in the [place] This is open 24 hours a day in the [place] Text the Crisis Text Line at 7728680772 (in the [place] Summary Perinatal depression is when a woman feels excessive sadness, anger, or anxiety during pregnancy or during the first 12 months after she gives birth. If perinatal depression is not managed properly, it can interfere with the woman's ability to take care of the baby. This condition is treated with medicines, talk therapy, stress reduction therapies, or a combination of treatments. Talk with your partner or family members  about your feelings. Ask for help when you need it. This information is not intended to replace advice given to you by your health care provider. Make sure you discuss any questions you have with your health care provider. Document Revised: 02/18/2021 Document Reviewed: 01/18/2020 Elsevier Patient Education  2024 [employer].

## 2023-04-29 NOTE — Progress Notes (Signed)
Subjective   Patient ID: Diana Robinson, female    DOB: 18-Nov-2000, 22 y.o.   MRN: [id number]  Chief Complaint  Patient presents with   Depression    Referring provider: Ivonne Andrew, NP  Diana Robinson is a 22 y.o. female with Past Medical History: No date: Heart murmur No date: Hypertension 05/2019: Migraine 10/2020: Vitamin D deficiency   HPI  Patient presents today for an acute visit.  She states that she is currently having postpartum depression.  Her baby was born 3 months ago.  She is breast-feeding.  She does already have an upcoming appointment on Monday to start therapy.  We will trial Lexapro.  Patient denies any suicidal thoughts.  She states that she has not felt like she would harm herself or anyone else.  She states that she does have good family support.  Denies f/c/s, n/v/d, hemoptysis, PND, leg swelling Denies chest pain or edema       No Known Allergies  Immunization History  Administered Date(s) Administered   Tdap 10/11/2022    Tobacco History: Social History   Tobacco Use  Smoking Status Never  Smokeless Tobacco Never   Counseling given: Not Answered   Outpatient Encounter Medications as of 04/29/2023  Medication Sig   escitalopram (LEXAPRO) 10 MG tablet Take 1 tablet (10 mg total) by mouth daily.   NIFEdipine (PROCARDIA XL) 60 MG 24 hr tablet Take 1 tablet (60 mg total) by mouth daily.   [DISCONTINUED] furosemide (LASIX) 20 MG tablet Take 1 tablet (20 mg total) by mouth daily for 5 days.   [DISCONTINUED] norethindrone (ORTHO MICRONOR) 0.35 MG tablet Take 1 tablet (0.35 mg total) by mouth daily. (Patient not taking: Reported on 03/01/2023)   No facility-administered encounter medications on file as of 04/29/2023.    Review of Systems  Review of Systems  Constitutional: Negative.   HENT: Negative.    Cardiovascular: Negative.   Gastrointestinal: Negative.   Allergic/Immunologic: Negative.   Neurological: Negative.    Psychiatric/Behavioral: Negative.       Objective:   BP 133/81   Pulse 89   Temp (!) 97.3 F (36.3 C)   Resp 16   Ht 5\' 6"  (1.676 m)   Wt 202 lb (91.6 kg)   SpO2 98%   BMI 32.60 kg/m   Wt Readings from Last 5 Encounters:  04/29/23 202 lb (91.6 kg)  03/16/23 171 lb 6.4 oz (77.7 kg)  03/01/23 172 lb 6.4 oz (78.2 kg)  01/05/23 197 lb 14.4 oz (89.8 kg)  12/30/22 200 lb 3.2 oz (90.8 kg)     Physical Exam Vitals and nursing note reviewed.  Constitutional:      General: She is not in acute distress.    Appearance: She is well-developed.  Cardiovascular:     Rate and Rhythm: Normal rate and regular rhythm.  Pulmonary:     Effort: Pulmonary effort is normal.     Breath sounds: Normal breath sounds.  Neurological:     Mental Status: She is alert and oriented to person, place, and time.       Assessment & Plan:   Post partum depression -     Escitalopram Oxalate; Take 1 tablet (10 mg total) by mouth daily.  Dispense: 30 tablet; Refill: 2     Return in about 4 weeks (around 05/27/2023) for depression.   Patient Instructions  1. Post partum depression  - escitalopram (LEXAPRO) 10 MG tablet; Take 1 tablet (10 mg total) by mouth daily.  Dispense: 30 tablet; Refill: 2  Follow up:  Follow up in 1 month  Perinatal Depression When a woman feels excessive sadness, anger, or anxiety during pregnancy or during the first 12 months after she gives birth, she has a condition called perinatal depression. This can interfere with work, school, relationships, and other everyday activities. If it is not managed properly, it can also interfere with the woman's ability to take care of the baby. Symptoms of perinatal depression may feel worse when living with a newborn. Sometimes, these symptoms are left untreated because they are thought to be normal mood swings during and right after pregnancy. However, if you have intense symptoms of depression that last for more than 2 weeks, it is  important to talk with your health care provider. This may be perinatal depression. What are the causes? The exact cause of this condition is not known. Hormonal changes during and after pregnancy may play a role in causing perinatal depression. What increases the risk? You are more likely to develop this condition if: You have a personal or family history of depression, anxiety, or mood disorders. You experience a stressful life event during pregnancy, such as the death of a loved one. You have additional life stress, such as being a single parent. You do not have support from family members or loved ones, or you are in an abusive relationship. You have thyroid problems. What are the signs or symptoms? Symptoms of this condition include: Emotional symptoms, such as: Feeling sad or hopeless. Feelings of guilt. Feeling irritable or overwhelmed. Physical symptoms, such as: Changes with appetite or sleep. Lack of energy or motivation. Persistent headaches or stomach problems. Behavioral symptoms, such as: Difficulty concentrating or completing tasks. Loss of interest in hobbies or relationships. How is this diagnosed? This condition is diagnosed based on a physical exam and mental evaluation. In some cases, your health care provider may use a depression screening tool. This includes a list of questions that can help a health care provider diagnose depression. You may be referred to a mental health expert who specializes in treating perinatal depression. How is this treated? This condition may be treated with: Talk therapy with a mental health professional. This may be interpersonal psychotherapy, couples therapy, cognitive behavioral therapy, or mother-child bonding therapy. Medicines. Your health care provider will discuss the safety of the medicines prescribed during pregnancy and breastfeeding. Support groups. Brain stimulation or light therapies. Stress reduction therapies, such as  mindfulness. Follow these instructions at home: Lifestyle Do not use any products that contain nicotine or tobacco. These products include cigarettes, chewing tobacco, and vaping devices, such as e-cigarettes. If you need help quitting, ask your health care provider. Do not drink alcohol when you are pregnant. It is also safest not to drink alcohol if you are breastfeeding. After your baby is born, if you drink alcohol: Limit how much you have to 0-1 drink a day. Be aware of how much alcohol is in your drink. In the [place] one drink equals one 12 oz bottle of beer (355 mL), one 5 oz glass of wine (148 mL), or one 1 oz glass of hard liquor (44 mL). Consider joining a support group for new mothers. Ask your health care provider for recommendations. Take good care of yourself. Make sure you: Get as much sleep as possible. Talk with your partner about sharing the responsibility of getting up with your baby if possible and sharing child care responsibilities equally. Make sleep a priority. Eat a healthy  diet. This includes plenty of fruits and vegetables, whole grains, and lean proteins. Exercise regularly, as told by your health care provider. Ask your health care provider what exercises are safe for you. Talk with your partner about making sure you both have opportunities to exercise. General instructions Take over-the-counter and prescription medicines only as told by your health care provider. Talk with your partner or family members about your feelings during pregnancy. Share any concerns, needs, or anxieties that you may have. Do not be afraid to ask for help. Find a mental health professional, if needed. Ask for help with tasks or chores when you need it. Ask friends and family members to provide meals, watch your children, or help with cleaning. Keep all follow-up visits. This is important. Contact a health care provider if: You or people close to you notice that you have symptoms of  depression. Your symptoms of depression get worse. You take medicines and have side effects, such as nausea or sleep problems. Get help right away if: You feel like hurting yourself, your baby, or someone else. If you feel like you may hurt yourself or others, or have thoughts about taking your own life, get help right away. You can go to your nearest emergency department or: Call your local emergency services (911 in the [place] Call a suicide crisis helpline, such as the National Suicide Prevention Lifeline, at 3605631590 or 988 in the [place] This is open 24 hours a day in the [place] Text the Crisis Text Line at (774)710-1485 (in the [place] Summary Perinatal depression is when a woman feels excessive sadness, anger, or anxiety during pregnancy or during the first 12 months after she gives birth. If perinatal depression is not managed properly, it can interfere with the woman's ability to take care of the baby. This condition is treated with medicines, talk therapy, stress reduction therapies, or a combination of treatments. Talk with your partner or family members about your feelings. Ask for help when you need it. This information is not intended to replace advice given to you by your health care provider. Make sure you discuss any questions you have with your health care provider. Document Revised: 02/18/2021 Document Reviewed: 01/18/2020 Elsevier Patient Education  [address]       Ivonne Andrew, [place] 04/29/2023

## 2023-05-12 ENCOUNTER — Other Ambulatory Visit: Payer: Self-pay | Admitting: Nurse Practitioner

## 2023-05-12 DIAGNOSIS — F53 Postpartum depression: Secondary | ICD-10-CM

## 2023-05-27 ENCOUNTER — Ambulatory Visit: Payer: Self-pay | Admitting: Nurse Practitioner

## 2023-06-23 ENCOUNTER — Ambulatory Visit: Payer: Self-pay | Admitting: Nurse Practitioner

## 2023-10-05 ENCOUNTER — Ambulatory Visit: Payer: Medicaid Other | Admitting: Family Medicine

## 2023-10-10 ENCOUNTER — Other Ambulatory Visit (HOSPITAL_BASED_OUTPATIENT_CLINIC_OR_DEPARTMENT_OTHER): Payer: Self-pay | Admitting: Obstetrics & Gynecology

## 2023-10-10 DIAGNOSIS — O161 Unspecified maternal hypertension, first trimester: Secondary | ICD-10-CM

## 2023-10-10 DIAGNOSIS — O10919 Unspecified pre-existing hypertension complicating pregnancy, unspecified trimester: Secondary | ICD-10-CM

## 2023-10-21 IMAGING — DX DG CHEST 2V
2 series · 2 of 2 positions shown · non-contrast
Comparison: None

CLINICAL DATA: Chest pain for 4 days

EXAM:
CHEST - 2 VIEW

[chest pa]
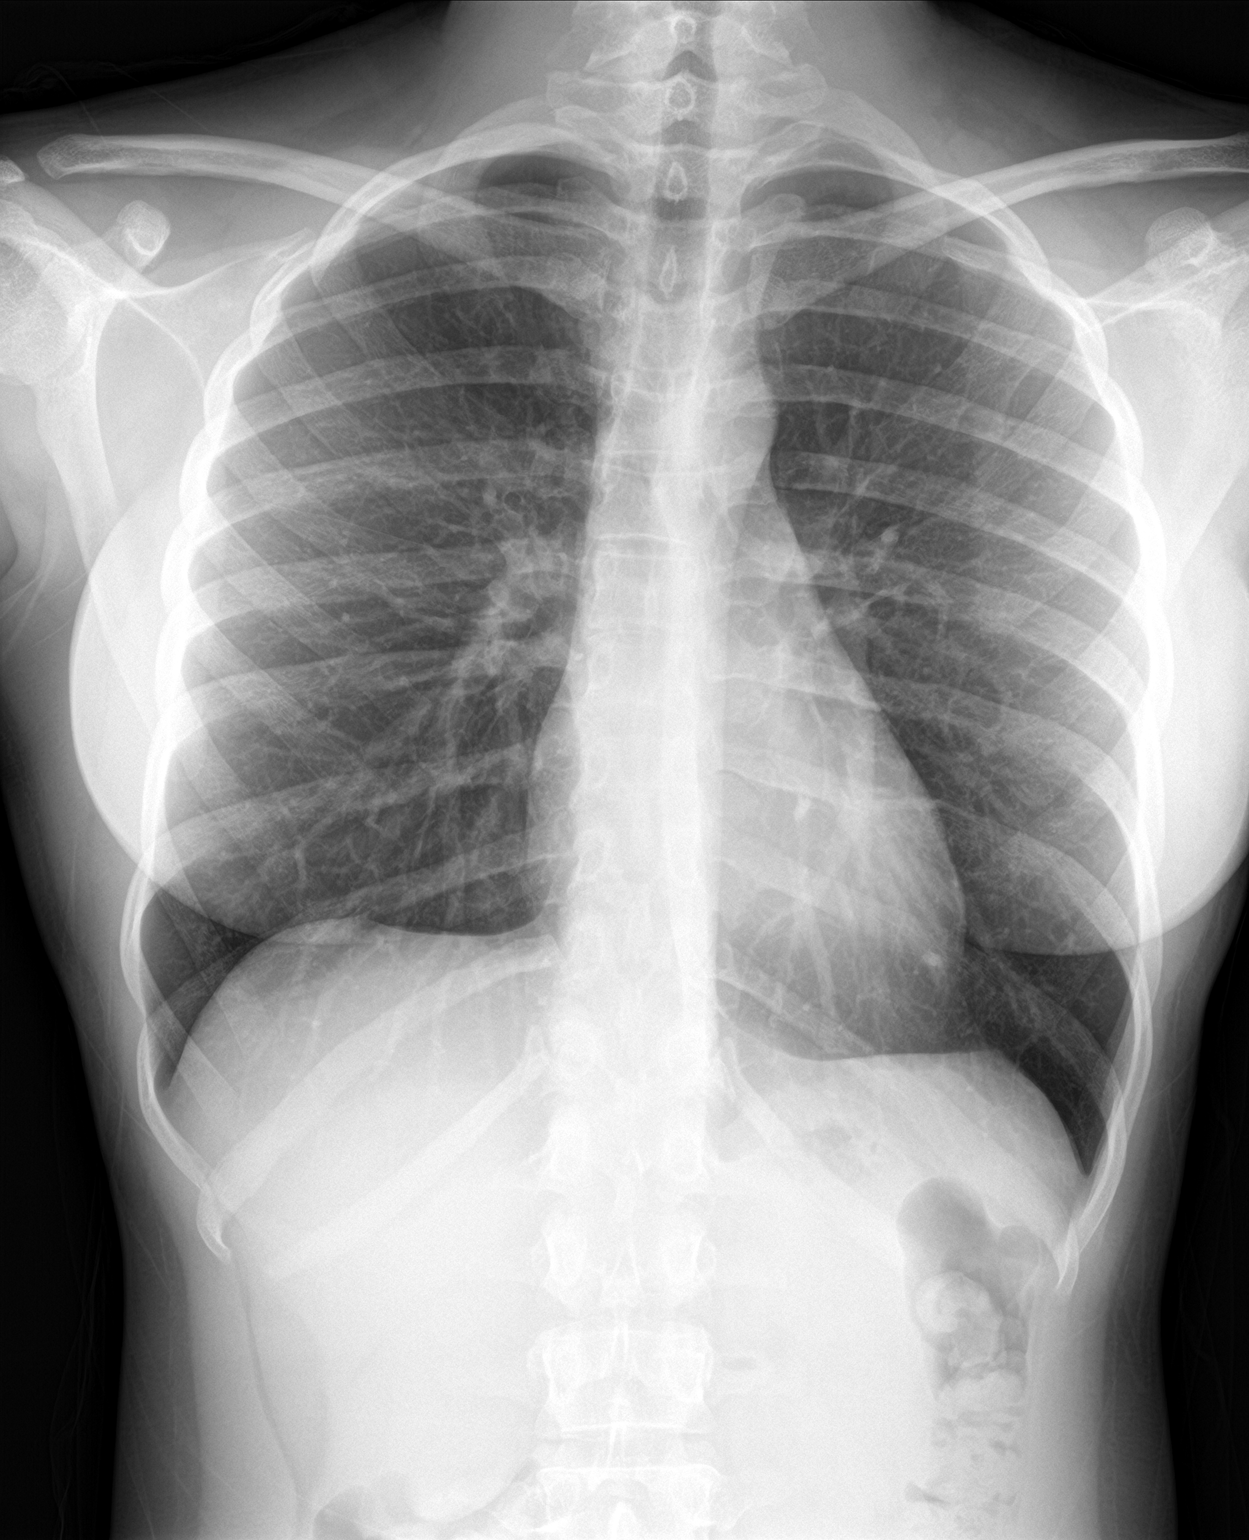

[chest lat]
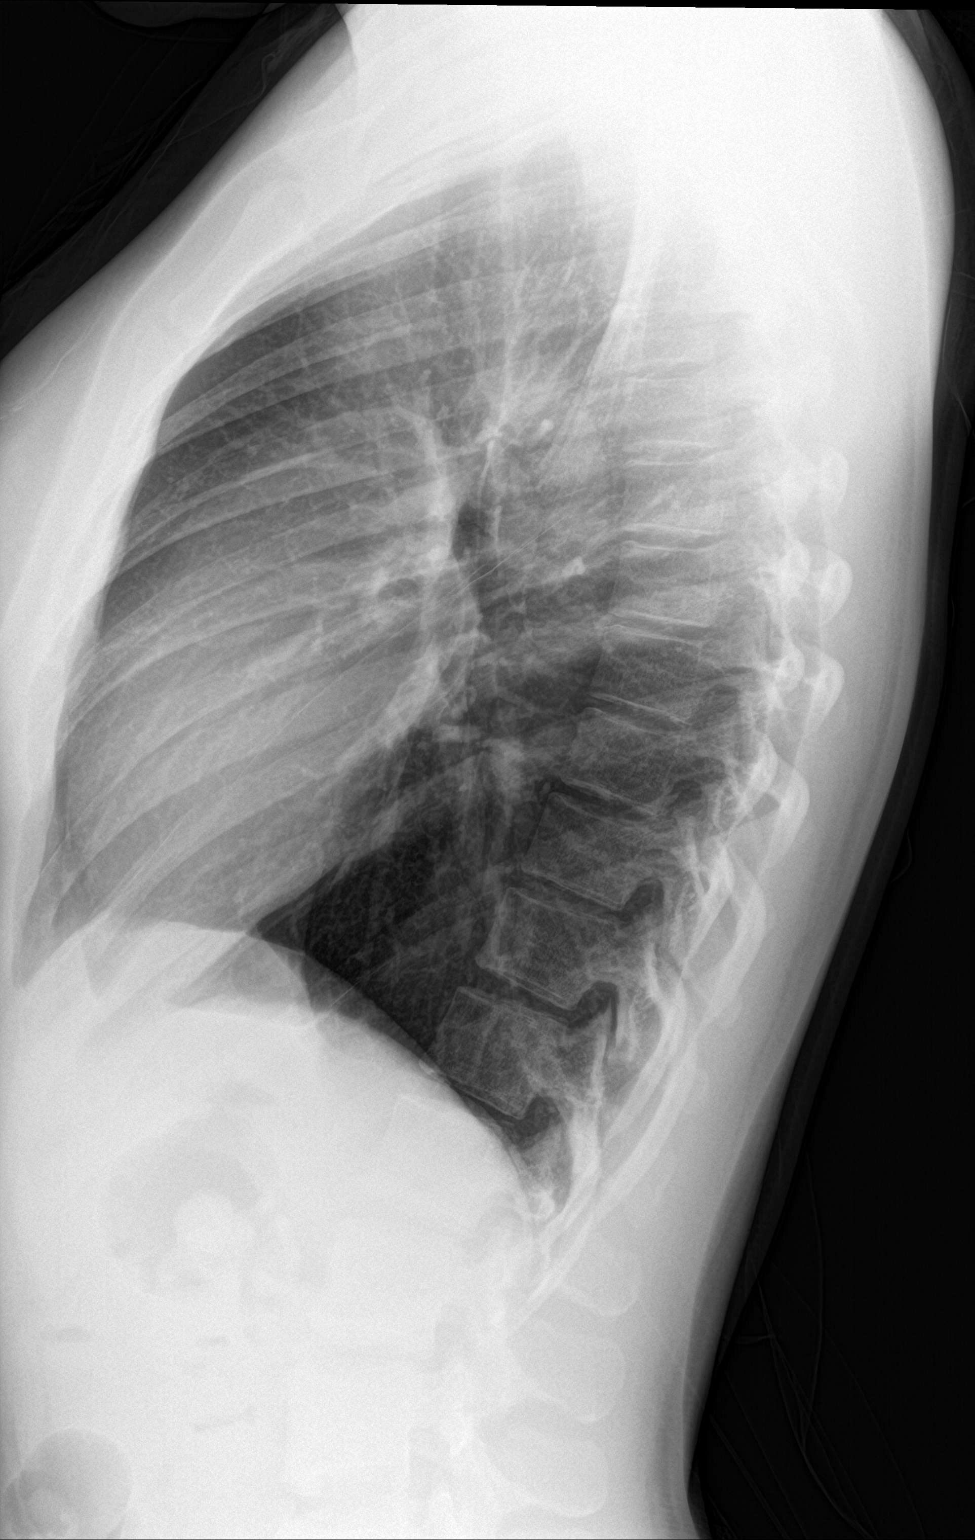

[2 of 2 positions shown; findings below may reference images not displayed]

FINDINGS: Normal heart size, mediastinal contours, and pulmonary vascularity.

Lungs clear.

No pleural effusion or pneumothorax.

Bones unremarkable.
IMPRESSION: Normal exam.

## 2023-10-30 ENCOUNTER — Emergency Department (HOSPITAL_BASED_OUTPATIENT_CLINIC_OR_DEPARTMENT_OTHER)
Admission: EM | Admit: 2023-10-30 | Discharge: 2023-10-30 | Disposition: A | Discharge status: Home / Self Care | Attending: Emergency Medicine | Admitting: Emergency Medicine

## 2023-10-30 ENCOUNTER — Other Ambulatory Visit: Payer: Self-pay

## 2023-10-30 ENCOUNTER — Emergency Department (HOSPITAL_BASED_OUTPATIENT_CLINIC_OR_DEPARTMENT_OTHER): Admitting: Radiology

## 2023-10-30 DIAGNOSIS — H81399 Other peripheral vertigo, unspecified ear: Secondary | ICD-10-CM | POA: Diagnosis not present

## 2023-10-30 DIAGNOSIS — R0609 Other forms of dyspnea: Secondary | ICD-10-CM | POA: Insufficient documentation

## 2023-10-30 DIAGNOSIS — I10 Essential (primary) hypertension: Secondary | ICD-10-CM | POA: Diagnosis not present

## 2023-10-30 DIAGNOSIS — Z79899 Other long term (current) drug therapy: Secondary | ICD-10-CM | POA: Diagnosis not present

## 2023-10-30 DIAGNOSIS — R42 Dizziness and giddiness: Secondary | ICD-10-CM | POA: Diagnosis present

## 2023-10-30 LAB — URINALYSIS, ROUTINE W REFLEX MICROSCOPIC
Bilirubin Urine: NEGATIVE
Glucose, UA: NEGATIVE mg/dL
Hgb urine dipstick: NEGATIVE
Leukocytes,Ua: NEGATIVE
Nitrite: NEGATIVE
Protein, ur: 30 mg/dL — AB
Specific Gravity, Urine: 1.033 — ABNORMAL HIGH (ref 1.005–1.030)
pH: 6 (ref 5.0–8.0)

## 2023-10-30 LAB — CBC
HCT: 36.6 % (ref 36.0–46.0)
Hemoglobin: 12.1 g/dL (ref 12.0–15.0)
MCH: 26.9 pg (ref 26.0–34.0)
MCHC: 33.1 g/dL (ref 30.0–36.0)
MCV: 81.5 fL (ref 80.0–100.0)
Platelets: 239 10*3/uL (ref 150–400)
RBC: 4.49 MIL/uL (ref 3.87–5.11)
RDW: 14.1 % (ref 11.5–15.5)
WBC: 5.3 10*3/uL (ref 4.0–10.5)
nRBC: 0 % (ref 0.0–0.2)

## 2023-10-30 LAB — BASIC METABOLIC PANEL
Anion gap: 9 (ref 5–15)
BUN: 14 mg/dL (ref 6–20)
CO2: 24 mmol/L (ref 22–32)
Calcium: 9.2 mg/dL (ref 8.9–10.3)
Chloride: 106 mmol/L (ref 98–111)
Creatinine, Ser: 1.01 mg/dL — ABNORMAL HIGH (ref 0.44–1.00)
GFR, Estimated: >60 mL/min (ref >60)
Glucose, Bld: 94 mg/dL (ref 70–99)
Potassium: 3.6 mmol/L (ref 3.5–5.1)
Sodium: 139 mmol/L (ref 135–145)

## 2023-10-30 LAB — HEPATIC FUNCTION PANEL
ALT: 9 U/L (ref 0–44)
AST: 15 U/L (ref 15–41)
Albumin: 4.2 g/dL (ref 3.5–5.0)
Alkaline Phosphatase: 124 U/L (ref 38–126)
Bilirubin, Direct: <0.1 mg/dL (ref 0.0–0.2)
Total Bilirubin: 0.4 mg/dL (ref 0.0–1.2)
Total Protein: 7.4 g/dL (ref 6.5–8.1)

## 2023-10-30 LAB — LIPASE, BLOOD: Lipase: 36 U/L (ref 11–51)

## 2023-10-30 LAB — BRAIN NATRIURETIC PEPTIDE: B Natriuretic Peptide: 11.4 pg/mL (ref 0.0–100.0)

## 2023-10-30 LAB — PREGNANCY, URINE: Preg Test, Ur: NEGATIVE

## 2023-10-30 MED ORDER — HYDROCHLOROTHIAZIDE 12.5 MG PO TABS: 12.5000 mg | ORAL_TABLET | Freq: Every day | ORAL | 1 refills | Status: AC

## 2023-10-30 MED ORDER — MECLIZINE HCL 12.5 MG PO TABS: 12.5000 mg | ORAL_TABLET | Freq: Three times a day (TID) | ORAL | 0 refills | Status: AC | PRN

## 2023-10-30 NOTE — ED Provider Notes (Signed)
  Physical Exam  BP (!) 141/93   Pulse 80   Temp 98 F (36.7 C)   Resp 18   Ht 5\' 6"  (1.676 m)   Wt 80.3 kg   SpO2 98%   BMI 28.57 kg/m   Physical Exam  Procedures  Ultrasound ED Echo  Date/Time: 10/30/2023 8:52 PM  Performed by: Rexford Maus, DO Authorized by: Rexford Maus, DO   Procedure details:    Indications: dyspnea     Views: subxiphoid, parasternal long axis view, parasternal short axis view, apical 4 chamber view and IVC view     Images: archived   Findings:    Pericardium: no pericardial effusion     LV Function: normal (>50% EF)     RV Diameter: normal     IVC: normal   Impression:    Impression: normal     ED Course / MDM    Medical Decision Making Amount and/or Complexity of Data Reviewed Labs: ordered. Radiology: ordered. ECG/medicine tests: ordered.         Rexford Maus, DO 10/30/23 2052

## 2023-10-30 NOTE — ED Triage Notes (Signed)
 Dizziness x several days- intermittent room spinning feeling accompanied by some SOB-denies syncope. -N/-V/-D. Denies bleeding. -CP.  No daily meds. HTN-does not take prescribed meds-makes her feel sick.

## 2023-10-30 NOTE — Discharge Instructions (Addendum)
 Your workup today showed no significant abnormalities. I am discharging you with 2 medications 1 is hydrochlorothiazide this is a medication for blood pressure it is a very low dose you may take it daily and is safe with breast-feeding The second is meclizine which you may take if you continue to have dizziness.  You may follow-up with ear nose and throat for your dizzy spells.  Given you a referral to cardiology they should be contacting you for an outpatient appointment.  Get help right away if: Your shortness of breath gets worse. You have shortness of breath when you are resting. You feel light-headed or you faint. You have a cough that is not controlled with medicines. You cough up blood. You have pain with breathing. You have pain in your chest, arms, shoulders, or abdomen. You have a fever. These symptoms may be an emergency. Get help right away. Call 911. Do not wait to see if the symptoms will go away. Do not drive yourself to the hospital.

## 2023-10-30 NOTE — ED Provider Notes (Signed)
 [facility] EM[facility] DEPART[facility] AT [facility] Provider Note   CSN: [id number] Arrival date & time: 10/30/23  1630     History {Add pertinent medical, surgical, social history, OB history to HPI:1} Chief Complaint  Patient presents with  . Dizziness    Kilah Drahos is a 23 y.o. female status post pregnancy normal pregnancy of a 33-month-old child with a history of hypertension who presents emergency department with intermittent and brief episodes of dizziness especially with turning as well as shortness of breath.  Patient reports that today she has had onset of dizziness with turning.  Last from 30 seconds to a minute.  She loses hearing in her ears briefly when this happens but it then returns.  She denies nausea vomiting ataxia she has never had any symptoms like this before.  She denies head injury.  She denies tinnitus.  Patient also reports that she has not been taking her nifedipine because after she gave birth she began having nausea with taking the medication.  She reports that she was having shortness of breath during her pregnancy but attributed it to the pregnancy.  Afterwards she continues to have intermittent episodes of dyspnea especially with exertion up stairs or long periods of talking when she feels like she has to catch her breath.  She has noticed trace edema in her lower extremities.  She denies chest pain orthopnea PND wheezing.   Dizziness      Home Medications Prior to Admission medications   Medication Sig Start Date End Date Taking? Authorizing Provider  escitalopram (LEXAPRO) 10 MG tablet TAKE 1 TABLET BY MOUTH EVERY DAY 05/12/23   Ivonne Andrew, NP  NIFEdipine (PROCARDIA XL/NIFEDICAL XL) 60 MG 24 hr tablet TAKE 1 TABLET BY MOUTH EVERY DAY 10/11/23   Jerene Bears, MD      Allergies    Patient has no known allergies.    Review of Systems   Review of Systems  Neurological:  Positive for dizziness.    Physical Exam Updated Vital Signs BP (!)  141/93   Pulse 80   Temp 98 F (36.7 C)   Resp 18   Ht 5\' 6"  (1.676 m)   Wt 80.3 kg   SpO2 98%   BMI 28.57 kg/m  Physical Exam  ED Results / Procedures / Treatments   Labs (all labs ordered are listed, but only abnormal results are displayed) Labs Reviewed  BASIC METABOLIC PANEL - Abnormal; Notable for the following components:      Result Value   Creatinine, Ser 1.01 (*)    All other components within normal limits  URINALYSIS, ROUTINE W REFLEX MICROSCOPIC - Abnormal; Notable for the following components:   Specific Gravity, Urine 1.033 (*)    Ketones, ur TRACE (*)    Protein, ur 30 (*)    Bacteria, UA RARE (*)    All other components within normal limits  CBC  PREGNANCY, URINE  LIPASE, BLOOD  HEPATIC FUNCTION PANEL  BRAIN NATRIURETIC PEPTIDE    EKG EKG Interpretation Date/Time:  Sunday October 30 2023 17:49:22 EDT Ventricular Rate:  84 PR Interval:  162 QRS Duration:  84 QT Interval:  364 QTC Calculation: 430 R Axis:   56  Text Interpretation: Normal sinus rhythm with sinus arrhythmia Normal ECG Baseline wander No significant change since last tracing Confirmed by Elayne Snare ([id number]) on 10/30/2023 6:20:19 PM  Radiology DG Chest Port 1 View Result Date: 10/30/2023 CLINICAL DATA:  Dizziness for several days.  Shortness of breath.  EXAM: PORTABLE CHEST 1 VIEW COMPARISON:  09/11/2021 FINDINGS: The heart size and mediastinal contours are within normal limits. Both lungs are clear. The visualized skeletal structures are unremarkable. IMPRESSION: No active disease. Electronically Signed   By: Burman Nieves M.D.   On: 10/30/2023 20:29    Procedures Procedures  {Document cardiac monitor, telemetry assessment procedure when appropriate:1}  Medications Ordered in ED Medications - No data to display  ED Course/ Medical Decision Making/ A&P   {   Click here for ABCD2, HEART and other calculatorsREFRESH Note before signing :1}                              Medical  Decision Making Amount and/or Complexity of Data Reviewed Labs: ordered. Radiology: ordered. ECG/medicine tests: ordered.  Risk Prescription drug management.   ***  {Document critical care time when appropriate:1} {Document review of labs and clinical decision tools ie heart score, Chads2Vasc2 etc:1}  {Document your independent review of radiology images, and any outside records:1} {Document your discussion with family members, caretakers, and with consultants:1} {Document social determinants of health affecting pt's care:1} {Document your decision making why or why not admission, treatments were needed:1} Final Clinical Impression(s) / ED Diagnoses Final diagnoses:  None    Rx / DC Orders ED Discharge Orders     None

## 2023-11-01 ENCOUNTER — Telehealth: Payer: Self-pay

## 2023-11-01 NOTE — Transitions of Care (Post Inpatient/ED Visit) (Unsigned)
   11/01/2023  Name: Diana Robinson MRN: [id number] DOB: 2001-07-12  Today's TOC FU Call Status:   Patient's Name and Date of Birth confirmed.  Transition Care Management Follow-up Telephone Call Date of Discharge: 10/30/23 Discharge Facility: [facility] ([facility]) Type of Discharge: Emergency Department Reason for ED Visit: Respiratory, Other: How have you been since you were released from the hospital?: Same Any questions or concerns?: No  Items Reviewed: Did you receive and understand the discharge instructions provided?: No Medications obtained,verified, and reconciled?: Yes (Medications Reviewed) Any new allergies since your discharge?: No Dietary orders reviewed?: No Do you have support at home?: Yes People in Home: spouse  Medications Reviewed Today: Medications Reviewed Today     Reviewed by Veneta Penton, CMA (Certified Medical Assistant) on 11/01/23 at 1507  Med List Status: <None>   Medication Order Taking? Sig Documenting Provider Last Dose Status Informant  escitalopram (LEXAPRO) 10 MG tablet [id number] Yes TAKE 1 TABLET BY MOUTH EVERY DAY Ivonne Andrew, NP Taking Active   hydrochlorothiazide (HYDRODIURIL) 12.5 MG tablet [id number] No Take 1 tablet (12.5 mg total) by mouth daily.  Patient not taking: Reported on 11/01/2023   Arthor Captain, PA-C Not Taking Active   meclizine (ANTIVERT) 12.5 MG tablet [id number] Yes Take 1-2 tablets (12.5-25 mg total) by mouth 3 (three) times daily as needed for dizziness. Arthor Captain, PA-C Taking Active   NIFEdipine (PROCARDIA XL/NIFEDICAL XL) 60 MG 24 hr tablet [id number] Yes TAKE 1 TABLET BY MOUTH EVERY DAY Jerene Bears, MD Taking Active             Home Care and Equipment/Supplies: Were Home Health Services Ordered?: No Any new equipment or medical supplies ordered?: No  Functional Questionnaire: Do you need assistance with bathing/showering or dressing?: No Do you need assistance with meal preparation?:  No Do you need assistance with eating?: No Do you have difficulty maintaining continence: No Do you need assistance with getting out of bed/getting out of a chair/moving?: No Do you have difficulty managing or taking your medications?: No  Follow up appointments reviewed: PCP Follow-up appointment confirmed?: Yes Date of PCP follow-up appointment?: 11/14/23 [facility]ist Hospital Follow-up appointment confirmed?: NA Do you need transportation to your follow-up appointment?: No Do you understand care options if your condition(s) worsen?: Yes-patient verbalized understanding    SIGNATURE Gerell Fortson, RMA

## 2023-11-10 ENCOUNTER — Ambulatory Visit: Attending: Physician Assistant | Admitting: Physician Assistant

## 2023-11-10 NOTE — Progress Notes (Signed)
 This encounter was created in error - please disregard.

## 2023-11-14 ENCOUNTER — Inpatient Hospital Stay: Payer: Self-pay | Admitting: Nurse Practitioner

## 2023-12-08 ENCOUNTER — Ambulatory Visit: Payer: Medicaid Other | Admitting: Family Medicine

## 2023-12-23 IMAGING — CR DG CHEST 2V
2 series · 2 of 2 positions shown · non-contrast
Comparison: 07/10/2021

CLINICAL DATA: Left-sided chest pain

EXAM:
CHEST - 2 VIEW

[w chest pa]
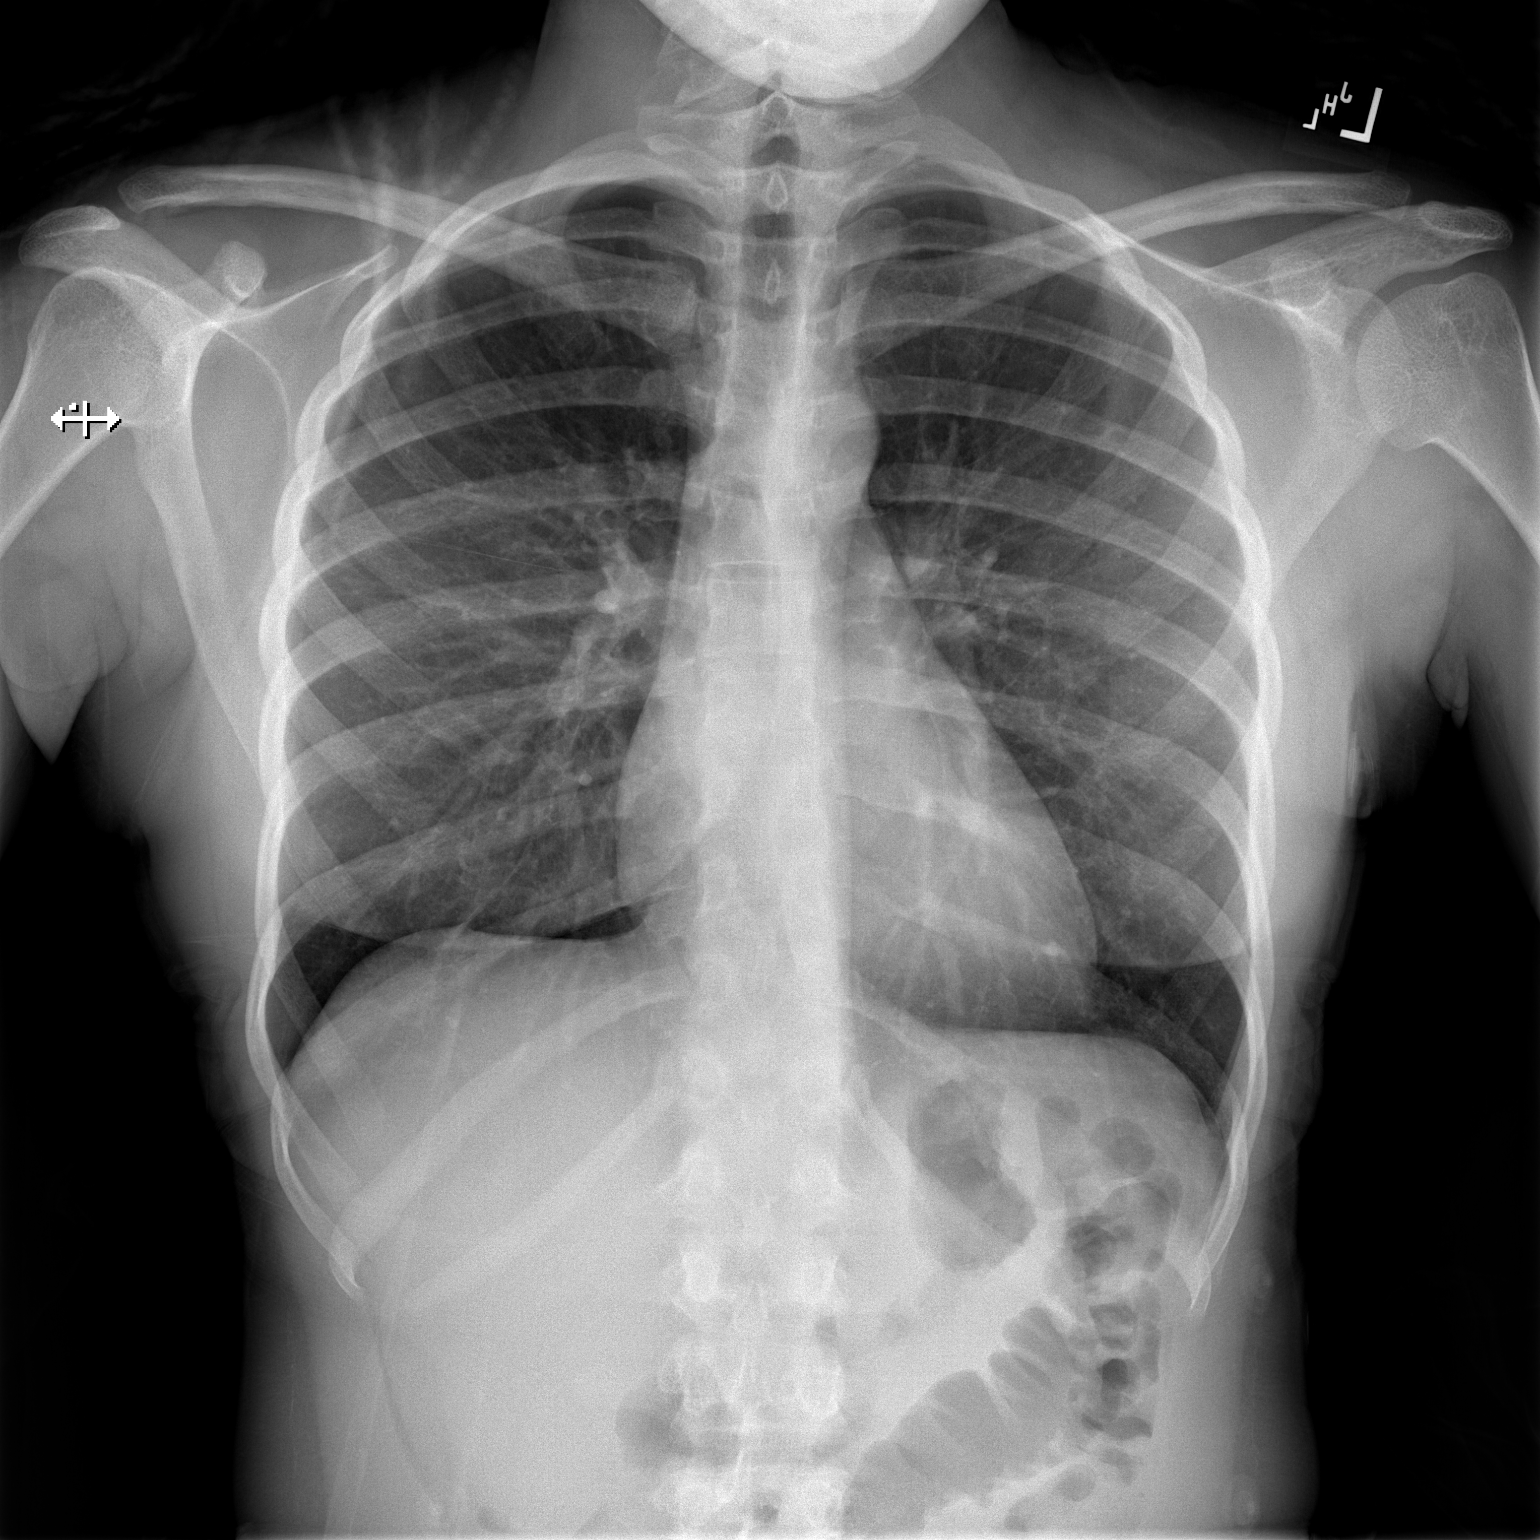

[w chest lat]
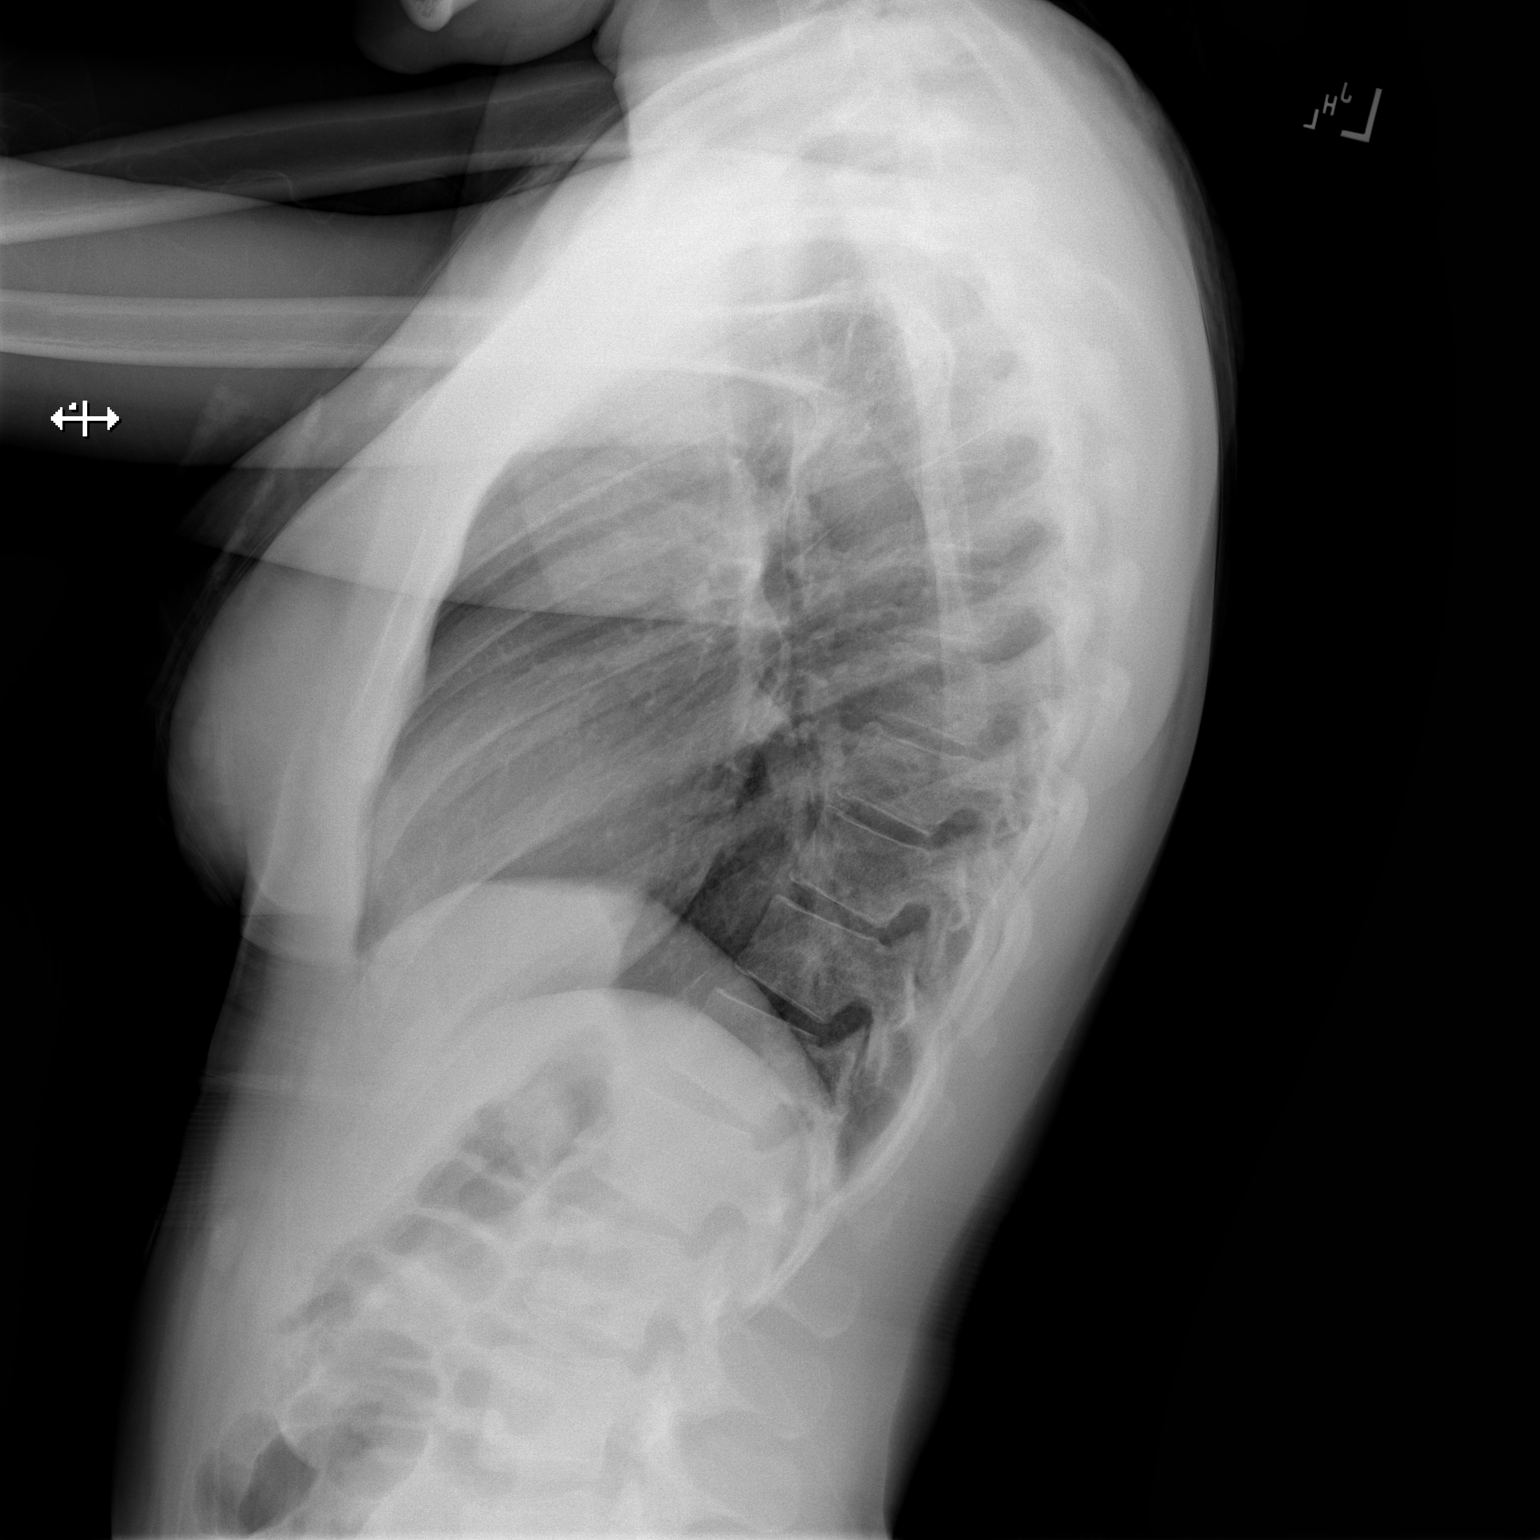

[2 of 2 positions shown; findings below may reference images not displayed]

FINDINGS: The heart size and mediastinal contours are within normal limits.
Both lungs are clear. The visualized skeletal structures are
unremarkable.
IMPRESSION: No active cardiopulmonary disease.

## 2024-08-28 ENCOUNTER — Telehealth: Payer: Self-pay

## 2024-08-28 NOTE — Transitions of Care (Post Inpatient/ED Visit) (Signed)
" ° °  08/28/2024  Name: Emmanuela Ghazi MRN: [id number] DOB: February 11, 2001  Today's TOC FU Call Status: Today's TOC FU Call Status:: Unsuccessful Call (1st Attempt) Unsuccessful Call (1st Attempt) Date: 08/28/24  Attempted to reach the patient regarding the most recent Inpatient/ED visit.  Follow Up Plan: Additional outreach attempts will be made to reach the patient to complete the Transitions of Care (Post Inpatient/ED visit) call.   Signature Geroge Hahn, CMA   "

## 2024-08-29 ENCOUNTER — Telehealth: Payer: Self-pay

## 2024-08-29 NOTE — Transitions of Care (Post Inpatient/ED Visit) (Signed)
" ° °  08/29/2024  Name: Diana Robinson MRN: [id number] DOB: 05-30-2001  Today's TOC FU Call Status: Today's TOC FU Call Status:: Unsuccessful Call (2nd Attempt) Unsuccessful Call (2nd Attempt) Date: 08/29/24  Attempted to reach the patient regarding the most recent Inpatient/ED visit.  Follow Up Plan: Additional outreach attempts will be made to reach the patient to complete the Transitions of Care (Post Inpatient/ED visit) call.   Signature Suzen Shove   CMA II  "

## 2024-08-30 ENCOUNTER — Telehealth: Payer: Self-pay

## 2024-08-30 NOTE — Transitions of Care (Post Inpatient/ED Visit) (Signed)
" ° °  08/30/2024  Name: Aneira Cavitt MRN: [id number] DOB: 04-Aug-2001  Today's TOC FU Call Status: Today's TOC FU Call Status:: Unsuccessful Call (3rd Attempt) Unsuccessful Call (3rd Attempt) Date: 08/30/24  Attempted to reach the patient regarding the most recent Inpatient/ED visit.  Follow Up Plan: No further outreach attempts will be made at this time. We have been unable to contact the patient.  Signature   Suzen Shove   CMA II  "
